# Patient Record
Sex: Female | Born: 1970 | Race: Black or African American | Hispanic: No | Marital: Married | Smoking: Never smoker
Health system: Southern US, Community
[De-identification: ages and names within clinical notes are randomized; demographics above are authoritative.]

## PROBLEM LIST (undated history)

## (undated) DIAGNOSIS — E785 Hyperlipidemia, unspecified: Secondary | ICD-10-CM

## (undated) DIAGNOSIS — M5126 Other intervertebral disc displacement, lumbar region: Secondary | ICD-10-CM

## (undated) DIAGNOSIS — E7800 Pure hypercholesterolemia, unspecified: Secondary | ICD-10-CM

## (undated) DIAGNOSIS — I10 Essential (primary) hypertension: Secondary | ICD-10-CM

---

## 2001-12-02 ENCOUNTER — Ambulatory Visit (HOSPITAL_COMMUNITY): Admission: RE | Admit: 2001-12-02 | Discharge: 2001-12-02 | Payer: Self-pay | Admitting: *Deleted

## 2001-12-25 ENCOUNTER — Ambulatory Visit (HOSPITAL_COMMUNITY): Admission: AD | Admit: 2001-12-25 | Discharge: 2001-12-25 | Payer: Self-pay | Admitting: *Deleted

## 2001-12-25 ENCOUNTER — Encounter (INDEPENDENT_AMBULATORY_CARE_PROVIDER_SITE_OTHER): Payer: Self-pay

## 2002-02-03 ENCOUNTER — Inpatient Hospital Stay (HOSPITAL_COMMUNITY): Admission: AD | Admit: 2002-02-03 | Discharge: 2002-02-03 | Payer: Self-pay | Admitting: *Deleted

## 2004-03-12 ENCOUNTER — Emergency Department (HOSPITAL_COMMUNITY): Admission: EM | Admit: 2004-03-12 | Discharge: 2004-03-12 | Payer: Self-pay | Admitting: Emergency Medicine

## 2004-03-13 ENCOUNTER — Emergency Department (HOSPITAL_COMMUNITY): Admission: EM | Admit: 2004-03-13 | Discharge: 2004-03-13 | Payer: Self-pay | Admitting: *Deleted

## 2004-10-26 ENCOUNTER — Emergency Department (HOSPITAL_COMMUNITY): Admission: EM | Admit: 2004-10-26 | Discharge: 2004-10-26 | Payer: Self-pay | Admitting: Emergency Medicine

## 2005-02-05 ENCOUNTER — Emergency Department (HOSPITAL_COMMUNITY): Admission: EM | Admit: 2005-02-05 | Discharge: 2005-02-05 | Payer: Self-pay | Admitting: Emergency Medicine

## 2005-12-25 ENCOUNTER — Ambulatory Visit: Payer: Self-pay | Admitting: Family Medicine

## 2007-04-11 ENCOUNTER — Emergency Department (HOSPITAL_COMMUNITY): Admission: EM | Admit: 2007-04-11 | Discharge: 2007-04-11 | Payer: Self-pay | Admitting: Emergency Medicine

## 2007-09-09 ENCOUNTER — Telehealth (INDEPENDENT_AMBULATORY_CARE_PROVIDER_SITE_OTHER): Payer: Self-pay | Admitting: *Deleted

## 2007-09-19 DIAGNOSIS — H5210 Myopia, unspecified eye: Secondary | ICD-10-CM | POA: Insufficient documentation

## 2007-09-19 DIAGNOSIS — I10 Essential (primary) hypertension: Secondary | ICD-10-CM | POA: Insufficient documentation

## 2007-09-19 DIAGNOSIS — H11009 Unspecified pterygium of unspecified eye: Secondary | ICD-10-CM | POA: Insufficient documentation

## 2007-10-17 ENCOUNTER — Ambulatory Visit: Payer: Self-pay | Admitting: Family Medicine

## 2007-10-18 ENCOUNTER — Ambulatory Visit: Payer: Self-pay | Admitting: Family Medicine

## 2007-10-18 LAB — CONVERTED CEMR LAB
ALT: 10 units/L (ref 0–35)
AST: 15 units/L (ref 0–37)
Albumin: 4.1 g/dL (ref 3.5–5.2)
Alkaline Phosphatase: 75 units/L (ref 39–117)
BUN: 11 mg/dL (ref 6–23)
Basophils Absolute: 0 10*3/uL (ref 0.0–0.1)
Basophils Relative: 1 % (ref 0–1)
CO2: 27 meq/L (ref 19–32)
Calcium: 9.2 mg/dL (ref 8.4–10.5)
Chloride: 105 meq/L (ref 96–112)
Cholesterol: 193 mg/dL (ref 0–200)
Creatinine, Ser: 0.55 mg/dL (ref 0.40–1.20)
Eosinophils Absolute: 0.1 10*3/uL — ABNORMAL LOW (ref 0.2–0.7)
Eosinophils Relative: 2 % (ref 0–5)
Glucose, Bld: 74 mg/dL (ref 70–99)
HCT: 43.3 % (ref 36.0–46.0)
HDL: 54 mg/dL (ref >39)
Hemoglobin: 14.1 g/dL (ref 12.0–15.0)
LDL Cholesterol: 115 mg/dL — ABNORMAL HIGH (ref 0–99)
Lymphocytes Relative: 43 % (ref 12–46)
Lymphs Abs: 1.8 10*3/uL (ref 0.7–4.0)
MCHC: 32.6 g/dL (ref 30.0–36.0)
MCV: 84.6 fL (ref 78.0–100.0)
Monocytes Absolute: 0.3 10*3/uL (ref 0.1–1.0)
Monocytes Relative: 8 % (ref 3–12)
Neutro Abs: 1.9 10*3/uL (ref 1.7–7.7)
Neutrophils Relative %: 47 % (ref 43–77)
Platelets: 184 10*3/uL (ref 150–400)
Potassium: 3.8 meq/L (ref 3.5–5.3)
RBC: 5.12 M/uL — ABNORMAL HIGH (ref 3.87–5.11)
RDW: 13.7 % (ref 11.5–15.5)
Sodium: 143 meq/L (ref 135–145)
TSH: 2.842 microintl units/mL (ref 0.350–5.50)
Total Bilirubin: 0.5 mg/dL (ref 0.3–1.2)
Total CHOL/HDL Ratio: 3.6
Total Protein: 7.7 g/dL (ref 6.0–8.3)
Triglycerides: 119 mg/dL (ref <150)
VLDL: 24 mg/dL (ref 0–40)
WBC: 4.1 10*3/uL (ref 4.0–10.5)

## 2007-12-22 ENCOUNTER — Ambulatory Visit: Payer: Self-pay | Admitting: Internal Medicine

## 2007-12-23 ENCOUNTER — Ambulatory Visit: Payer: Self-pay | Admitting: Nurse Practitioner

## 2007-12-23 DIAGNOSIS — M549 Dorsalgia, unspecified: Secondary | ICD-10-CM | POA: Insufficient documentation

## 2007-12-23 DIAGNOSIS — R319 Hematuria, unspecified: Secondary | ICD-10-CM | POA: Insufficient documentation

## 2007-12-23 DIAGNOSIS — M545 Low back pain, unspecified: Secondary | ICD-10-CM | POA: Insufficient documentation

## 2007-12-24 ENCOUNTER — Encounter (INDEPENDENT_AMBULATORY_CARE_PROVIDER_SITE_OTHER): Payer: Self-pay | Admitting: Nurse Practitioner

## 2008-01-02 ENCOUNTER — Ambulatory Visit: Payer: Self-pay | Admitting: Nurse Practitioner

## 2008-01-02 ENCOUNTER — Ambulatory Visit: Payer: Self-pay | Admitting: *Deleted

## 2008-01-02 LAB — CONVERTED CEMR LAB
Bilirubin Urine: NEGATIVE
Glucose, Urine, Semiquant: NEGATIVE
Ketones, urine, test strip: NEGATIVE
Nitrite: NEGATIVE
Protein, U semiquant: NEGATIVE
Specific Gravity, Urine: 1.01
Urobilinogen, UA: 0.2
WBC Urine, dipstick: NEGATIVE
pH: 6

## 2008-01-03 ENCOUNTER — Ambulatory Visit (HOSPITAL_COMMUNITY): Admission: RE | Admit: 2008-01-03 | Discharge: 2008-01-03 | Payer: Self-pay | Admitting: Family Medicine

## 2008-01-17 ENCOUNTER — Ambulatory Visit: Payer: Self-pay | Admitting: Nurse Practitioner

## 2008-01-19 ENCOUNTER — Ambulatory Visit (HOSPITAL_COMMUNITY): Admission: RE | Admit: 2008-01-19 | Discharge: 2008-01-19 | Payer: Self-pay | Admitting: Internal Medicine

## 2008-01-31 ENCOUNTER — Ambulatory Visit: Payer: Self-pay | Admitting: Nurse Practitioner

## 2008-02-14 ENCOUNTER — Encounter: Admission: RE | Admit: 2008-02-14 | Discharge: 2008-04-04 | Payer: Self-pay | Admitting: Nurse Practitioner

## 2008-02-14 ENCOUNTER — Encounter (INDEPENDENT_AMBULATORY_CARE_PROVIDER_SITE_OTHER): Payer: Self-pay | Admitting: Family Medicine

## 2008-02-15 ENCOUNTER — Ambulatory Visit: Payer: Self-pay | Admitting: Nurse Practitioner

## 2008-02-15 DIAGNOSIS — L578 Other skin changes due to chronic exposure to nonionizing radiation: Secondary | ICD-10-CM | POA: Insufficient documentation

## 2008-09-26 ENCOUNTER — Emergency Department (HOSPITAL_COMMUNITY): Admission: EM | Admit: 2008-09-26 | Discharge: 2008-09-26 | Payer: Self-pay | Admitting: Family Medicine

## 2008-12-24 ENCOUNTER — Telehealth (INDEPENDENT_AMBULATORY_CARE_PROVIDER_SITE_OTHER): Payer: Self-pay | Admitting: *Deleted

## 2009-03-13 ENCOUNTER — Ambulatory Visit: Payer: Self-pay | Admitting: Nurse Practitioner

## 2009-09-02 ENCOUNTER — Ambulatory Visit: Payer: Self-pay | Admitting: Nurse Practitioner

## 2009-09-02 DIAGNOSIS — K089 Disorder of teeth and supporting structures, unspecified: Secondary | ICD-10-CM | POA: Insufficient documentation

## 2009-09-02 DIAGNOSIS — B079 Viral wart, unspecified: Secondary | ICD-10-CM | POA: Insufficient documentation

## 2009-09-16 ENCOUNTER — Ambulatory Visit: Payer: Self-pay | Admitting: Nurse Practitioner

## 2009-09-30 ENCOUNTER — Ambulatory Visit: Payer: Self-pay | Admitting: Nurse Practitioner

## 2009-10-07 ENCOUNTER — Ambulatory Visit: Payer: Self-pay | Admitting: Nurse Practitioner

## 2009-10-29 ENCOUNTER — Ambulatory Visit: Payer: Self-pay | Admitting: Internal Medicine

## 2009-10-29 ENCOUNTER — Ambulatory Visit: Payer: Self-pay | Admitting: Nurse Practitioner

## 2009-11-13 ENCOUNTER — Encounter (INDEPENDENT_AMBULATORY_CARE_PROVIDER_SITE_OTHER): Payer: Self-pay | Admitting: Nurse Practitioner

## 2009-11-13 ENCOUNTER — Ambulatory Visit: Payer: Self-pay | Admitting: Family Medicine

## 2009-11-19 ENCOUNTER — Encounter (INDEPENDENT_AMBULATORY_CARE_PROVIDER_SITE_OTHER): Payer: Self-pay | Admitting: Nurse Practitioner

## 2009-11-20 ENCOUNTER — Ambulatory Visit: Payer: Self-pay | Admitting: Nurse Practitioner

## 2009-11-20 LAB — CONVERTED CEMR LAB
BUN: 10 mg/dL (ref 6–23)
CO2: 25 meq/L (ref 19–32)
Calcium: 9 mg/dL (ref 8.4–10.5)
Chloride: 101 meq/L (ref 96–112)
Creatinine, Ser: 0.67 mg/dL (ref 0.40–1.20)
Glucose, Bld: 125 mg/dL — ABNORMAL HIGH (ref 70–99)
Potassium: 3.8 meq/L (ref 3.5–5.3)
Sodium: 142 meq/L (ref 135–145)

## 2009-12-13 ENCOUNTER — Ambulatory Visit: Payer: Self-pay | Admitting: Nurse Practitioner

## 2009-12-13 LAB — CONVERTED CEMR LAB

## 2009-12-14 ENCOUNTER — Encounter (INDEPENDENT_AMBULATORY_CARE_PROVIDER_SITE_OTHER): Payer: Self-pay | Admitting: Nurse Practitioner

## 2009-12-16 ENCOUNTER — Encounter (INDEPENDENT_AMBULATORY_CARE_PROVIDER_SITE_OTHER): Payer: Self-pay | Admitting: Nurse Practitioner

## 2009-12-30 ENCOUNTER — Ambulatory Visit: Payer: Self-pay | Admitting: Nurse Practitioner

## 2009-12-30 LAB — CONVERTED CEMR LAB
Bilirubin Urine: NEGATIVE
Glucose, Urine, Semiquant: NEGATIVE
Ketones, urine, test strip: NEGATIVE
Nitrite: NEGATIVE
Protein, U semiquant: NEGATIVE
Specific Gravity, Urine: 1.015
Urobilinogen, UA: 0.2
WBC Urine, dipstick: NEGATIVE
pH: 5.5

## 2010-04-11 ENCOUNTER — Ambulatory Visit: Payer: Self-pay | Admitting: Nurse Practitioner

## 2010-09-12 ENCOUNTER — Ambulatory Visit: Payer: Self-pay | Admitting: Nurse Practitioner

## 2010-10-27 ENCOUNTER — Emergency Department (HOSPITAL_COMMUNITY): Admission: EM | Admit: 2010-10-27 | Discharge: 2010-10-27 | Payer: Self-pay | Admitting: Emergency Medicine

## 2010-12-12 ENCOUNTER — Telehealth (INDEPENDENT_AMBULATORY_CARE_PROVIDER_SITE_OTHER): Payer: Self-pay | Admitting: Nurse Practitioner

## 2010-12-16 ENCOUNTER — Ambulatory Visit: Admit: 2010-12-16 | Payer: Self-pay | Admitting: Nurse Practitioner

## 2011-01-04 LAB — CONVERTED CEMR LAB
ALT: 11 units/L (ref 0–35)
AST: 15 units/L (ref 0–37)
Albumin: 4.1 g/dL (ref 3.5–5.2)
Alkaline Phosphatase: 68 units/L (ref 39–117)
Basophils Absolute: 0 10*3/uL (ref 0.0–0.1)
Basophils Relative: 0 % (ref 0–1)
Bilirubin Urine: NEGATIVE
Bilirubin, Direct: <0.1 mg/dL (ref 0.0–0.3)
Chlamydia, DNA Probe: NEGATIVE
Eosinophils Absolute: 0.1 10*3/uL (ref 0.0–0.7)
Eosinophils Relative: 1 % (ref 0–5)
GC Probe Amp, Genital: NEGATIVE
Glucose, Urine, Semiquant: NEGATIVE
HCT: 40.3 % (ref 36.0–46.0)
Hemoglobin: 13.3 g/dL (ref 12.0–15.0)
KOH Prep: NEGATIVE
Ketones, urine, test strip: NEGATIVE
Lymphocytes Relative: 40 % (ref 12–46)
Lymphs Abs: 2.2 10*3/uL (ref 0.7–4.0)
MCHC: 33 g/dL (ref 30.0–36.0)
MCV: 83.1 fL (ref 78.0–100.0)
Microalb, Ur: 2.11 mg/dL — ABNORMAL HIGH (ref 0.00–1.89)
Monocytes Absolute: 0.4 10*3/uL (ref 0.1–1.0)
Monocytes Relative: 7 % (ref 3–12)
Neutro Abs: 2.8 10*3/uL (ref 1.7–7.7)
Neutrophils Relative %: 51 % (ref 43–77)
Nitrite: NEGATIVE
Platelets: 199 10*3/uL (ref 150–400)
Protein, U semiquant: NEGATIVE
RBC: 4.85 M/uL (ref 3.87–5.11)
RDW: 13.2 % (ref 11.5–15.5)
Rapid HIV Screen: NEGATIVE
Specific Gravity, Urine: 1.01
TSH: 1.193 microintl units/mL (ref 0.350–4.500)
Total Bilirubin: 0.2 mg/dL — ABNORMAL LOW (ref 0.3–1.2)
Total Protein: 7.3 g/dL (ref 6.0–8.3)
Urobilinogen, UA: 0.2
WBC Urine, dipstick: NEGATIVE
WBC: 5.5 10*3/uL (ref 4.0–10.5)
pH: 6.5

## 2011-01-06 NOTE — Assessment & Plan Note (Signed)
Summary: Blood pressure check   Patient Instructions: 1)  Blood pressure is still uncontrolled. 2)  You will need to start lisinopril 20mg  by mouth daily. 3)  Recheck blood pressure in 2 weeks Nurse Visit   Impression & Recommendations:  Problem # 1:  HYPERTENSION (ICD-401.9) still uncontrolled will need to change meds Her updated medication list for this problem includes:    Lisinopril 20 Mg Tabs (Lisinopril) ..... One tablet by mouth daily for blood pressure *note change dose**  Complete Medication List: 1)  Lisinopril 20 Mg Tabs (Lisinopril) .... One tablet by mouth daily for blood pressure *note change dose** 2)  Ibuprofen 600 Mg Tabs (Ibuprofen) .Marland Kitchen.. 1 tablet by mouth two times a day as needed for pain 3)  Solage 2-0.01 % Soln (Mequinol-tretinoin) .Marland Kitchen.. 1 application topically two times a day to affected area 4)  Ecotrin Low Strength 81 Mg Tbec (Aspirin) .Marland Kitchen.. 1 tablet by mouth daily   Vital Signs:  Patient profile:   40 year old female BP sitting:   158 / 93  (left arm) Cuff size:   regular  Allergies: No Known Drug Allergies  Orders Added: 1)  Est. Patient Level I [95621] Prescriptions: LISINOPRIL 20 MG TABS (LISINOPRIL) One tablet by mouth daily for blood pressure *note change dose**  #30 x 1   Entered by:   Vesta Mixer CMA   Authorized by:   Lehman Prom FNP   Signed by:   Vesta Mixer CMA on 10/07/2009   Method used:   Print then Give to Patient   RxID:   [id number]

## 2011-01-06 NOTE — Assessment & Plan Note (Signed)
Summary: Acute - Low back pain   Vital Signs:  Patient profile:   40 year old female Height:      61 inches Weight:      152 pounds BMI:     28.82 BSA:     1.68 Temp:     98.8 degrees F oral Pulse rate:   88 / minute Pulse rhythm:   regular Resp:     20 per minute BP sitting:   98 / 56  (left arm) Cuff size:   regular  Vitals Entered By: Levon Hedger (March 13, 2009 12:02 PM) CC: pain in lower back on the left side, hurts when lying down, Hypertension Management Is Patient Diabetic? No Pain Assessment Patient in pain? yes     Location: lower back  Does patient need assistance? Functional Status Self care Ambulation Normal   History of Present Illness:  Pt into the office still with continued lower back pain and hip pain Pt went for x-rays and CT on last year.  She also went to physical therapy for 3 months Pt is not currently taking any medications. Reports that back feels worse when she is sitting   Hypertension History:      pt is taking hctz 12.5 mg as ordered. c/o dry mouth all the time.  has to constantly drink water.        Positive major cardiovascular risk factors include hypertension.  Negative major cardiovascular risk factors include female age less than 24 years old and non-tobacco-user status.    Medications Prior to Update: 1)  Hydrochlorothiazide 12.5 Mg  Tabs (Hydrochlorothiazide) .... Take 1 Tab  By Mouth Every Morning For Blood Pressure 2)  Ibuprofen 600 Mg  Tabs (Ibuprofen) .Marland Kitchen.. 1 Tablet By Mouth Two Times A Day As Needed For Pain 3)  Solage 2-0.01 %  Soln (Mequinol-Tretinoin) .Marland Kitchen.. 1 Application Topically Two Times A Day To Affected Area 4)  Ecotrin Low Strength 81 Mg  Tbec (Aspirin) .Marland Kitchen.. 1 Tablet By Mouth Daily  Allergies (verified): No Known Drug Allergies  Review of Systems CV:  Denies chest pain or discomfort. Resp:  Denies cough. GI:  Denies abdominal pain, nausea, and vomiting. MS:  Complains of low back pain.  Physical  Exam  General:  alert.   Head:  normocephalic.   Lungs:  normal breath sounds.   Heart:  normal rate and regular rhythm.   Abdomen:  normal bowel sounds.     Detailed Back/Spine Exam  Lumbosacral Exam:  Inspection-deformity:    Normal Palpation-spinal tenderness:  Abnormal    Location:  L5-S1 Sitting Straight Leg Raise:    Right:  negative    Left:  negative   Impression & Recommendations:  Problem # 1:  BACK PAIN, LUMBAR (ICD-724.2) pt has been to PT  will start ibuprofen as needed  advised doughnut Her updated medication list for this problem includes:    Ibuprofen 600 Mg Tabs (Ibuprofen) .Marland Kitchen... 1 tablet by mouth two times a day as needed for pain    Ecotrin Low Strength 81 Mg Tbec (Aspirin) .Marland Kitchen... 1 tablet by mouth daily  Problem # 2:  HYPERTENSION (ICD-401.9) c/o dry mouth. will change bp meds to lisinopril Her updated medication list for this problem includes:    Lisinopril 5 Mg Tabs (Lisinopril) ..... One tablet by mouth daily for blood pressure **pharmacy - discontinue hctz**  Complete Medication List: 1)  Lisinopril 5 Mg Tabs (Lisinopril) .... One tablet by mouth daily for blood pressure **pharmacy - discontinue  hctz** 2)  Ibuprofen 600 Mg Tabs (Ibuprofen) .Marland Kitchen.. 1 tablet by mouth two times a day as needed for pain 3)  Solage 2-0.01 % Soln (Mequinol-tretinoin) .Marland Kitchen.. 1 application topically two times a day to affected area 4)  Ecotrin Low Strength 81 Mg Tbec (Aspirin) .Marland Kitchen.. 1 tablet by mouth daily  Hypertension Assessment/Plan:      The patient's hypertensive risk group is category A: No risk factors and no target organ damage.  Her calculated 10 year risk of coronary heart disease is 1 %.  Today's blood pressure is 98/56.  Her blood pressure goal is < 140/90.   Patient Instructions: 1)  You need to get a doughnut pillow to help prevent pain. 2)  You can take ibuprofen as needed for pain 3)  Follow up as needed. Prescriptions: LISINOPRIL 5 MG TABS (LISINOPRIL) One  tablet by mouth daily for blood pressure **Pharmacy - discontinue hctz**  #30 x 5   Entered and Authorized by:   Lehman Prom FNP   Signed by:   Lehman Prom FNP on 03/13/2009   Method used:   Print then Give to Patient   RxID:   [id number] IBUPROFEN 600 MG  TABS (IBUPROFEN) 1 tablet by mouth two times a day as needed for pain  #50 x 1   Entered and Authorized by:   Lehman Prom FNP   Signed by:   Lehman Prom FNP on 03/13/2009   Method used:   Print then Give to Patient   RxID:   [id number]

## 2011-01-06 NOTE — Assessment & Plan Note (Signed)
Summary: HTN   Vital Signs:  Patient profile:   40 year old female Menstrual status:  regular Height:      61 inches Weight:      148 pounds BMI:     28.07 BSA:     1.66 Temp:     97.4 degrees F oral Pulse rate:   69 / minute Pulse rhythm:   regular Resp:     16 per minute BP sitting:   131 / 81  (left arm) Cuff size:   regular  Vitals Entered By: [place] Shannon (Apr 11, 2010 2:14 PM) CC: 3 month check up on blood pressure, Hypertension Management Pain Assessment Patient in pain? yes     Location: stomach  Does patient need assistance? Functional Status Self care Ambulation Normal   CC:  3 month check up on blood pressure and Hypertension Management.  History of Present Illness:  Pt into the office for follow up on htn.  Social - pt is going to travel to [place].  She will be gone for 2 months to visit her family. Mother and father are still in [place]  No acute complaints today  Hypertension History:      She denies headache, chest pain, and palpitations.  She notes no problems with any antihypertensive medication side effects.  pt is taking meds as ordered.  Further comments include: no side effects from the medications.        Positive major cardiovascular risk factors include hypertension.  Negative major cardiovascular risk factors include female age less than 67 years old and non-tobacco-user status.        Further assessment for target organ damage reveals no history of ASHD, cardiac end-organ damage (CHF/LVH), stroke/TIA, peripheral vascular disease, renal insufficiency, or hypertensive retinopathy.     Allergies (verified): 1)  ! Lisinopril  Review of Systems CV:  Denies chest pain or discomfort. Resp:  Denies cough. GI:  Complains of indigestion; denies abdominal pain, nausea, and vomiting; one episode of indigestion on yesterday but pt admits that she ate some spicy foods on yesterday.  Physical Exam  General:  alert.   Head:  normocephalic.   covered Lungs:  normal breath sounds.   Heart:  normal rate and regular rhythm.   Msk:  up to the exam table Neurologic:  alert & oriented X3.   Psych:  Oriented X3.     Impression & Recommendations:  Problem # 1:  HYPERTENSION (ICD-401.9) BP  is doing well continue current med will give 90 day supply due to upcoming travel plans Her updated medication list for this problem includes:    Hydrochlorothiazide 25 Mg Tabs (Hydrochlorothiazide) .Marland Kitchen... Take one (1) by mouth daily    Atenolol 25 Mg Tabs (Atenolol) ..... One tablet by mouth daily for blood pressure  Complete Medication List: 1)  Ibuprofen 600 Mg Tabs (Ibuprofen) .Marland Kitchen.. 1 tablet by mouth two times a day as needed for pain 2)  Solage 2-0.01 % Soln (Mequinol-tretinoin) .Marland Kitchen.. 1 application topically two times a day to affected area 3)  Ecotrin Low Strength 81 Mg Tbec (Aspirin) .Marland Kitchen.. 1 tablet by mouth daily 4)  Hydrochlorothiazide 25 Mg Tabs (Hydrochlorothiazide) .... Take one (1) by mouth daily 5)  Atenolol 25 Mg Tabs (Atenolol) .... One tablet by mouth daily for blood pressure  Hypertension Assessment/Plan:      The patient's hypertensive risk group is category A: No risk factors and no target organ damage.  Her calculated 10 year risk of coronary heart disease  is 1 %.  Today's blood pressure is 131/81.  Her blood pressure goal is < 140/90.  Patient Instructions: 1)  Your blood pressure is doing much better. 2)  Continue your current medications. 3)  Have a great trip. Prescriptions: ATENOLOL 25 MG TABS (ATENOLOL) One tablet by mouth daily for blood pressure  #90 x 1   Entered and Authorized by:   Lehman Prom FNP   Signed by:   Lehman Prom FNP on 04/11/2010   Method used:   Print then Give to Patient   RxID:   [id number] HYDROCHLOROTHIAZIDE 25 MG TABS (HYDROCHLOROTHIAZIDE) Take one (1) by mouth daily  #90 x 1   Entered and Authorized by:   Lehman Prom FNP   Signed by:   Lehman Prom FNP on 04/11/2010    Method used:   Print then Give to Patient   RxID:   [id number]

## 2011-01-06 NOTE — Letter (Signed)
Summary: Retasure Resullts 10/29/2009  Retasure Resullts 10/29/2009   Imported By: Percell Miller 11/19/2009 11:07:48  _____________________________________________________________________  External Attachment:    Type:   Image     Comment:   External Document  Appended Document: Retasure Resullts 10/29/2009  Diabetes Management Exam:    Eye Exam:       Eye Exam done elsewhere          Date: 10/29/2009          Results: suspect glaucoma - optho referral within 3 months          Done by: retasure  Diabetes Management Assessment/Plan:      Her blood pressure goal is < 140/90.      Clinical Lists Changes  Observations: Added new observation of EYES COMMENT: 11/2010 (11/20/2009 8:35) Added new observation of EYE EXAM BY: retasure (10/29/2009 8:36) Added new observation of DMEYEEXMRES: suspect glaucoma - optho referral within 3 months (10/29/2009 8:36) Added new observation of DIAB EYE EX: suspect glaucoma - optho referral within 3 months (10/29/2009 8:36)

## 2011-01-06 NOTE — Letter (Signed)
Summary: Handout Printed  Printed Handout:  - [facility] ([facility]) 

## 2011-01-06 NOTE — Assessment & Plan Note (Signed)
Summary: HTN/Solar dermatitis   Vital Signs:  Patient Profile:   40 Years Old Female Height:     61 inches Weight:      152 pounds BMI:     28.82 BSA:     1.68 Temp:     97.0 degrees F oral Pulse rate:   72 / minute Pulse rhythm:   regular Resp:     18 per minute BP sitting:   150 / 90  (left arm) Cuff size:   regular  Pt. in pain?   no  Vitals Entered By: Levon Hedger (February 15, 2008 9:47 AM)              Is Patient Diabetic? No  Does patient need assistance? Ambulation Normal Comments pt needs Enteric coated ASA, HCTZ, Solage, Hydrocortisone cream     Chief Complaint:  medication refills.  History of Present Illness:  HTN - Pt into the office for blood pressure f/u.    Physical therapy - she went for evaluation on yesturday.  She will then start her therapy twice weekly starting next week. She does have some ibuprofen to take as needed but she does not like to take it.  Facial discoloration - pt has been given hydrocortisone cream.  She presents today with a cream that she has known other people from her country to use that have similar problems.  the discoloration is due to sun exposure Solage is the name of the cream.  Instructions are to use twice daily.  Hypertension History:      She denies headache, chest pain, and palpitations.  She notes no problems with any antihypertensive medication side effects.  Further comments include: Pt hs been checking her blood pressure at home.  Log reflects 128/88, 133/86, 128/88, 138/86, 119/75, 137/95, 150/98, 146/95, 136/86, 125/82, 132/85, 137/87, 136/86, 126/76, 131/81, 140/90. 128/88, 134/87, 150/92, 138/86,147/90, 136/86, 145/90.        Positive major cardiovascular risk factors include hypertension.  Negative major cardiovascular risk factors include female age less than 87 years old and non-tobacco-user status.       Prior Medication List:  HYDROCHLOROTHIAZIDE 12.5 MG  TABS (HYDROCHLOROTHIAZIDE) Take 1 tab  by mouth  every morning IBUPROFEN 600 MG  TABS (IBUPROFEN) 1 tablet by mouth two times a day as needed for pain HYDROCORTISONE 2.5 %  OINT (HYDROCORTISONE) 1 application to affected area daily   Current Allergies: No known allergies     Risk Factors: Tobacco use:  never Drug use:  no Alcohol use:  no   Review of Systems  Eyes      Complains of blurring.      when she reads for long period of time  CV      Denies chest pain or discomfort, fatigue, and shortness of breath with exertion.  Resp      Denies chest discomfort and shortness of breath.  GI      Denies abdominal pain, nausea, and vomiting.  MS      Complains of low back pain.  Derm      facial discoloration   Physical Exam  General:     alert.   Head:     covered Eyes:     glasses Nose:     no external deformity.   Lungs:     normal respiratory effort, no intercostal retractions, no accessory muscle use, and normal breath sounds.   Heart:     normal rate, regular rhythm, and no murmur.   Abdomen:  soft, non-tender, and normal bowel sounds.   Msk:     up to the exam table Neurologic:     alert & oriented X3.   Skin:     bil cheeks with areas of hyperpigmentation no macules or papules affected area is smooth Psych:     Oriented X3.      Impression & Recommendations:  Problem # 1:  HYPERTENSION (ICD-401.9) continue current meds. sodium restriction Her updated medication list for this problem includes:    Hydrochlorothiazide 12.5 Mg Tabs (Hydrochlorothiazide) .Marland Kitchen... Take 1 tab  by mouth every morning for blood pressure   Problem # 2:  UNSPECIFIED DERMATITIS DUE TO SUN (ICD-692.70) will give Rx for cream as requested.  she will need to get from an outside pharmacy. The following medications were removed from the medication list:    Hydrocortisone 2.5 % Oint (Hydrocortisone) .Marland Kitchen... 1 application to affected area daily   Problem # 3:  BACK PAIN, LUMBAR (ICD-724.2) continue physical therapy as  ordered Her updated medication list for this problem includes:    Ibuprofen 600 Mg Tabs (Ibuprofen) .Marland Kitchen... 1 tablet by mouth two times a day as needed for pain    Ecotrin Low Strength 81 Mg Tbec (Aspirin) .Marland Kitchen... 1 tablet by mouth daily   Complete Medication List: 1)  Hydrochlorothiazide 12.5 Mg Tabs (Hydrochlorothiazide) .... Take 1 tab  by mouth every morning for blood pressure 2)  Ibuprofen 600 Mg Tabs (Ibuprofen) .Marland Kitchen.. 1 tablet by mouth two times a day as needed for pain 3)  Solage 2-0.01 % Soln (Mequinol-tretinoin) .Marland Kitchen.. 1 application topically two times a day to affected area 4)  Ecotrin Low Strength 81 Mg Tbec (Aspirin) .Marland Kitchen.. 1 tablet by mouth daily  Hypertension Assessment/Plan:      The patient's hypertensive risk group is category A: No risk factors and no target organ damage.  Her calculated 10 year risk of coronary heart disease is 2 %.  Today's blood pressure is 150/90.  Her blood pressure goal is < 140/90.   Patient Instructions: 1)  Follow up as needed 2)  Continue physical therapy as ordered    Prescriptions: HYDROCHLOROTHIAZIDE 12.5 MG  TABS (HYDROCHLOROTHIAZIDE) Take 1 tab  by mouth every morning for blood pressure  #30 x 5   Entered and Authorized by:   Lehman Prom FNP   Signed by:   Lehman Prom FNP on 02/15/2008   Method used:   Print then Give to Patient   RxID:   [id number] SOLAGE 2-0.01 %  SOLN (MEQUINOL-TRETINOIN) 1 application topically two times a day to affected area  #30gm x 1   Entered and Authorized by:   Lehman Prom FNP   Signed by:   Lehman Prom FNP on 02/15/2008   Method used:   Print then Give to Patient   RxID:   [id number]  ]

## 2011-01-06 NOTE — Assessment & Plan Note (Signed)
Summary: Blood pressure check  Nurse Visit   Vital Signs:  Patient profile:   40 year old female Pulse rate:   79 / minute Pulse rhythm:   regular BP sitting:   152 / 91  (left arm) Cuff size:   regular  Vitals Entered By: [place] Shannon (November 20, 2009 9:09 AM) pt says she took meds before 6 am  Patient Instructions: 1)  Blood pressure - still high. 2)  Continue HCTZ. 3)  Start atenolol 25mg  by mouth nightly. 4)  Schedule a follow up with n.martin,fnp in 3 weeks for blood presure   Impression & Recommendations:  Problem # 1:  HYPERTENSION (ICD-401.9) still elevated today will add atenolol Her updated medication list for this problem includes:    Hydrochlorothiazide 25 Mg Tabs (Hydrochlorothiazide) .Marland Kitchen... Take one (1) by mouth daily    Atenolol 25 Mg Tabs (Atenolol) ..... One tablet by mouth daily for blood pressure  Complete Medication List: 1)  Ibuprofen 600 Mg Tabs (Ibuprofen) .Marland Kitchen.. 1 tablet by mouth two times a day as needed for pain 2)  Solage 2-0.01 % Soln (Mequinol-tretinoin) .Marland Kitchen.. 1 application topically two times a day to affected area 3)  Ecotrin Low Strength 81 Mg Tbec (Aspirin) .Marland Kitchen.. 1 tablet by mouth daily 4)  Hydrochlorothiazide 25 Mg Tabs (Hydrochlorothiazide) .... Take one (1) by mouth daily 5)  Atenolol 25 Mg Tabs (Atenolol) .... One tablet by mouth daily for blood pressure  Hypertension Assessment/Plan:      The patient's hypertensive risk group is category A: No risk factors and no target organ damage.  Her calculated 10 year risk of coronary heart disease is 2 %.  Today's blood pressure is 152/91.  Her blood pressure goal is < 140/90.    Hypertension History:      She denies headache, chest pain, and palpitations.  She notes no problems with any antihypertensive medication side effects.        Positive major cardiovascular risk factors include hypertension.  Negative major cardiovascular risk factors include female age less than 74 years old and  non-tobacco-user status.        Further assessment for target organ damage reveals no history of ASHD, cardiac end-organ damage (CHF/LVH), stroke/TIA, peripheral vascular disease, renal insufficiency, or hypertensive retinopathy.      Allergies: No Known Drug Allergies  Orders Added: 1)  Est. Patient Level II [78469] Prescriptions: ATENOLOL 25 MG TABS (ATENOLOL) One tablet by mouth daily for blood pressure  #30 x 1   Entered and Authorized by:   Lehman Prom FNP   Signed by:   Lehman Prom FNP on 11/20/2009   Method used:   Print then Give to Patient   RxID:   [id number]

## 2011-01-06 NOTE — Assessment & Plan Note (Signed)
Summary: Review labs   Vital Signs:  Patient profile:   40 year old female Menstrual status:  regular Weight:      148.2 pounds BMI:     28.10 BSA:     1.66 Temp:     97.9 degrees F oral Pulse rate:   70 / minute Pulse rhythm:   regular Resp:     16 per minute BP sitting:   128 / 82  (left arm) Cuff size:   regular  Vitals Entered By: Levon Hedger (December 30, 2009 12:09 PM) CC: follow-up visit discuss lab results, Hypertension Management, Depression Is Patient Diabetic? No Pain Assessment Patient in pain? yes     Location: muscles  Does patient need assistance? Functional Status Self care Ambulation Normal   CC:  follow-up visit discuss lab results, Hypertension Management, and Depression.  History of Present Illness:  Pt into the office for follow up on lab results U/s shows hematuria culture negative CT of Abd/pelvis done in 2009 for hematuria which was negative. no current problems.    Hypertension History:      She denies headache, chest pain, and palpitations.  She notes no problems with any antihypertensive medication side effects.        Positive major cardiovascular risk factors include hypertension.  Negative major cardiovascular risk factors include female age less than 24 years old and non-tobacco-user status.        Further assessment for target organ damage reveals no history of ASHD, cardiac end-organ damage (CHF/LVH), stroke/TIA, peripheral vascular disease, renal insufficiency, or hypertensive retinopathy.      Allergies (verified): 1)  ! Lisinopril  Review of Systems CV:  Denies chest pain or discomfort. Resp:  Denies cough. GI:  Denies abdominal pain, nausea, and vomiting. GU:  Denies dysuria and hematuria.  Physical Exam  General:  alert.   Head:  normocephalic.   Msk:  normal ROM.   Neurologic:  alert & oriented X3.   Skin:  color normal.   Psych:  Oriented X3.     Impression & Recommendations:  Problem # 1:  HEMATURIA  UNSPECIFIED (ICD-599.70)  labs reviewed with pt CT done in 2009 seems satisifed at no further workup at this time  Orders: UA Dipstick w/o Micro (manual) (16109)  Problem # 2:  HYPERTENSION (ICD-401.9) stable Her updated medication list for this problem includes:    Hydrochlorothiazide 25 Mg Tabs (Hydrochlorothiazide) .Marland Kitchen... Take one (1) by mouth daily    Atenolol 25 Mg Tabs (Atenolol) ..... One tablet by mouth daily for blood pressure  Complete Medication List: 1)  Ibuprofen 600 Mg Tabs (Ibuprofen) .Marland Kitchen.. 1 tablet by mouth two times a day as needed for pain 2)  Solage 2-0.01 % Soln (Mequinol-tretinoin) .Marland Kitchen.. 1 application topically two times a day to affected area 3)  Ecotrin Low Strength 81 Mg Tbec (Aspirin) .Marland Kitchen.. 1 tablet by mouth daily 4)  Hydrochlorothiazide 25 Mg Tabs (Hydrochlorothiazide) .... Take one (1) by mouth daily 5)  Atenolol 25 Mg Tabs (Atenolol) .... One tablet by mouth daily for blood pressure  Hypertension Assessment/Plan:      The patient's hypertensive risk group is category A: No risk factors and no target organ damage.  Her calculated 10 year risk of coronary heart disease is 1 %.  Today's blood pressure is 128/82.  Her blood pressure goal is < 140/90.  Patient Instructions: 1)  You have blood in your urine 2)  Follow up in this office at least every 3 months.  Laboratory  Results   Urine Tests  Date/Time Received: December 30, 2009 12:32 PM   Routine Urinalysis   Color: lt. yellow Appearance: Clear Glucose: negative   (Normal Range: Negative) Bilirubin: negative   (Normal Range: Negative) Ketone: negative   (Normal Range: Negative) Spec. Gravity: 1.015   (Normal Range: 1.003-1.035) Blood: moderate   (Normal Range: Negative) pH: 5.5   (Normal Range: 5.0-8.0) Protein: negative   (Normal Range: Negative) Urobilinogen: 0.2   (Normal Range: 0-1) Nitrite: negative   (Normal Range: Negative) Leukocyte Esterace: negative   (Normal Range: Negative)

## 2011-01-06 NOTE — Letter (Signed)
Summary: Handout Printed  Printed Handout:  - Low Back Pain

## 2011-01-06 NOTE — Assessment & Plan Note (Signed)
Summary: 3629 PT/SIDE EFFECTS TO LISINOPRIL//KT  Nurse Visit   Vital Signs:  Patient profile:   40 year old female BP sitting:   154 / 99  (left arm) Cuff size:   regular  Patient Instructions: 1)  Stop Lisinopril 2)  Start Hydrochlorothiazide 25mg  3)  Repeat blood pressure in 2 weeks.  Comments PT COMPLAINING OF LISINOPRIL MAKING HER FEEL NAUSEATED, DIZZY, CHEST PAIN AND COUGHING. Per DR. iNMAN STOP LISINOPRIL AND START HCTZ 25MG  1PO once daily RPT BP IN TWO WKS     Allergies: No Known Drug Allergies Prescriptions: HYDROCHLOROTHIAZIDE 25 MG TABS (HYDROCHLOROTHIAZIDE) Take one (1) by mouth daily  #30 x 1   Entered by:   Vesta Mixer CMA   Authorized by:   Syliva Overman MD   Signed by:   Vesta Mixer CMA on 11/13/2009   Method used:   Telephoned to ...       [facility] [address] [phone]* (retail)       [address]       [place], [place]  [address]       Ph: [phone]       Fax: [phone]   RxID:   [id number]   Current Allergies: No known allergies      Vital Signs:  Patient profile:   40 year old female BP sitting:   154 / 99  (left arm) Cuff size:   regular  Appended Document: 3629 PT/SIDE EFFECTS TO LISINOPRIL//KT   Appended Document: 3629 PT/SIDE EFFECTS TO LISINOPRIL//KT

## 2011-01-06 NOTE — Assessment & Plan Note (Signed)
Summary: HTN   Vital Signs:  Patient profile:   40 year old female Menstrual status:  regular Weight:      147.7 pounds Temp:     97.7 degrees F oral Pulse rate:   68 / minute Pulse rhythm:   regular Resp:     16 per minute BP sitting:   108 / 60  (left arm) Cuff size:   regular  Vitals Entered By: Levon Hedger (September 12, 2010 11:16 AM) CC: follow-up visit, Hypertension Management Is Patient Diabetic? No Pain Assessment Patient in pain? no       Does patient need assistance? Functional Status Self care Ambulation Normal   CC:  follow-up visit and Hypertension Management.  History of Present Illness:  Pt into the office for f/u on HTN Pt has been to [place] for 2 months.  S/p dental extraction on yesterday - hx of broken tooth which had abscessed.  Still with some pain right side of face.  She has taken hydrocodone as ordered by the dentist but reports that it made her very sleepy.  Pt is asking if she needs to take this medication for pain if she has to keep taking the medication.    Hypertension History:      She denies headache, chest pain, and palpitations.  She notes no problems with any antihypertensive medication side effects.  Pt is still taking her BP meds as ordered.        Positive major cardiovascular risk factors include hypertension.  Negative major cardiovascular risk factors include female age less than 73 years old and non-tobacco-user status.        Further assessment for target organ damage reveals no history of ASHD, cardiac end-organ damage (CHF/LVH), stroke/TIA, peripheral vascular disease, renal insufficiency, or hypertensive retinopathy.     Allergies (verified): 1)  ! Lisinopril  Review of Systems General:  +mouth pain s/p dental procedure on yesterday. CV:  Denies chest pain or discomfort. Resp:  Denies cough. GI:  Denies abdominal pain, nausea, and vomiting. Derm:  right index finger. Neuro:  Denies headaches.  Physical  Exam  General:  alert.   Head:  normocephalic.   head covered Lungs:  normal breath sounds.   Heart:  normal rate and regular rhythm.   Abdomen:  normal bowel sounds.   Msk:  normal ROM.   Neurologic:  alert & oriented X3.     Impression & Recommendations:  Problem # 1:  HYPERTENSION (ICD-401.9) BP is doing well will refill meds Her updated medication list for this problem includes:    Hydrochlorothiazide 25 Mg Tabs (Hydrochlorothiazide) .Marland Kitchen... Take one (1) by mouth daily    Atenolol 25 Mg Tabs (Atenolol) ..... One tablet by mouth daily for blood pressure pt declined flu vaccine today  Problem # 3:  WART, RIGHT HAND (ICD-078.10) reviewed dx with pt she will use otc preparations  Complete Medication List: 1)  Ibuprofen 600 Mg Tabs (Ibuprofen) .Marland Kitchen.. 1 tablet by mouth two times a day as needed for pain 2)  Solage 2-0.01 % Soln (Mequinol-tretinoin) .Marland Kitchen.. 1 application topically two times a day to affected area 3)  Ecotrin Low Strength 81 Mg Tbec (Aspirin) .Marland Kitchen.. 1 tablet by mouth daily 4)  Hydrochlorothiazide 25 Mg Tabs (Hydrochlorothiazide) .... Take one (1) by mouth daily 5)  Atenolol 25 Mg Tabs (Atenolol) .... One tablet by mouth daily for blood pressure  Hypertension Assessment/Plan:      The patient's hypertensive risk group is category A: No risk factors  and no target organ damage.  Her calculated 10 year risk of coronary heart disease is 1 %.  Today's blood pressure is 108/60.  Her blood pressure goal is < 140/90.  Patient Instructions: 1)  You have refused the flu vaccine today. 2)  If you decide you would like the flu vaccine then notify this office 3)  Follow up in January for a complete physical exam. 4)  No food after midnight before this visit. 5)  You will need labs, PAP, mammogram, u/a. Prescriptions: ATENOLOL 25 MG TABS (ATENOLOL) One tablet by mouth daily for blood pressure  #90 x 1   Entered and Authorized by:   Lehman Prom FNP   Signed by:   Lehman Prom  FNP on 09/12/2010   Method used:   Print then Give to Patient   RxID:   [id number] HYDROCHLOROTHIAZIDE 25 MG TABS (HYDROCHLOROTHIAZIDE) Take one (1) by mouth daily  #90 x 1   Entered and Authorized by:   Lehman Prom FNP   Signed by:   Lehman Prom FNP on 09/12/2010   Method used:   Print then Give to Patient   RxID:   [id number]   Prevention & Chronic Care Immunizations   Influenza vaccine: refused  (09/12/2010)   Influenza vaccine deferral: Refused  (12/13/2009)    Tetanus booster: 12/13/2009: Tdap    Pneumococcal vaccine: Not documented  Other Screening   Pap smear:  Specimen Adequacy: Satisfactory for evaluation.     (12/13/2009)   Pap smear action/deferral: Ordered  (12/13/2009)   Pap smear due: 12/2010   Smoking status: never  (12/13/2009)  Lipids   Total Cholesterol: 193  (10/18/2007)   LDL: 115  (10/18/2007)   LDL Direct: Not documented   HDL: 54  (10/18/2007)   Triglycerides: 119  (10/18/2007)  Hypertension   Last Blood Pressure: 108 / 60  (09/12/2010)   Serum creatinine: 0.67  (11/13/2009)   Serum potassium 3.8  (11/13/2009)  Self-Management Support :    Hypertension self-management support: Not documented

## 2011-01-06 NOTE — Letter (Signed)
Summary: *HSN Results Follow up  [facility]  [address] [place], [place] [address]   Phone: [phone]  Fax: [phone]      11/20/2009   ANNETE AYUSO [facility] [address] [place], [place]  [address]   Dear  Ms. Maryam Noll,                            ____S.Drinkard,FNP   ____D. Gore,FNP       ____B. McPherson,MD   ____V. Rankins,MD    ____E. Mulberry,MD    __X__N. Daphine Deutscher, FNP  ____D. Reche Dixon, MD    ____K. Philipp Deputy, MD    ____Other     This letter is to inform you that your recent test(s):  Retasure Eye Exam    _______ is within acceptable limits  _______ requires a medication change  _______ requires a follow-up lab visit  ___X____ requires a follow-up visit with an eye doctor  Comments: The retasure eye exam done at [facility] [address] was abnormal.  It is recommended that you get an eye exam by an eye doctor within the next 3 months. Feel free to find your own eye doctor or [facility] can refer you however there is a $115 co-payment required at time of service with this referral.     _________________________________________________________ If you have any questions, please contact our office [phone].                    Sincerely,    Lehman Prom FNP [facility]

## 2011-01-06 NOTE — Progress Notes (Signed)
Summary: Polydipsia  Phone Note Call from Patient Call back at Home Phone [phone]   Caller: Patient Call For: [phone] Summary of Call: The pt feels her mouth is very dry and if she drink water it only help her for a short amount of time. Dr Barbaraann Barthel Initial call taken by: Manon Hilding,  December 24, 2008 10:10 AM  Follow-up for Phone Call        pt states she feels dehydrated. She is drinking alot of and still feels like this. She states her mouth is constantly dry and she is not sure if its from her medication. (hctz takes every morning) or does the patient need to come in to see provider.Marland KitchenMarland KitchenMikey College CMA  December 25, 2008 3:51 PM   Additional Follow-up for Phone Call Additional follow up Details #1::        #1 She needs to see nurse for a fasting CBG.  #2 Please clarify with patient who is her PCP.Marland Kitchenshe has seen N.Martin multiple times since she saw me for first visit. It would make more sense for N.Martin to be her assigned PCP...? Additional Follow-up by: Beverley Fiedler MD,  December 25, 2008 5:20 PM    Additional Follow-up for Phone Call Additional follow up Details #2::     left message to return........................Marland KitchenMikey College CMA  January 03, 2009 11:51 AM   appt scheduled for patient............. Follow-up by: Mikey College CMA,  January 09, 2009 8:57 AM

## 2011-01-06 NOTE — Letter (Signed)
Summary: REHAB INITIAL SUMMARY  REHAB INITIAL SUMMARY   Imported By: Arta Bruce 02/17/2008 10:13:43  _____________________________________________________________________  External Attachment:    Type:   Image     Comment:   External Document

## 2011-01-06 NOTE — Assessment & Plan Note (Signed)
Summary: 2209 pt/pain lower back/gk   Vital Signs:  Patient Profile:   40 Years Old Female Height:     61 inches Weight:      152 pounds BMI:     28.82 Temp:     98.3 degrees F oral Pulse rate:   82 / minute Pulse rhythm:   regular Resp:     20 per minute BP sitting:   128 / 82  (left arm) Cuff size:   regular  Vitals Entered By:  (December 22, 2007 4:23 PM)                Patient locked herself out of car while it was running at office.  Did not come back in office after getting back in vehicle.  Current Allergies: No known allergies         Complete Medication List: 1)  Hydrochlorothiazide 12.5 Mg Tabs (Hydrochlorothiazide) .... Take 1 tab  by mouth every morning     ]

## 2011-01-06 NOTE — Assessment & Plan Note (Signed)
Summary: pain lower area per couple weeks//gk   Vital Signs:  Patient Profile:   40 Years Old Female Height:     61 inches Weight:      145 pounds BMI:     27.50 BSA:     1.65 Temp:     99.0 degrees F oral Pulse rate:   68 / minute Pulse rhythm:   regular Resp:     18 per minute BP sitting:   138 / 90  (left arm)  Pt. in pain?   yes    Location:   lower back  Vitals Entered By: Bradd Burner               Does patient need assistance? Functional Status Self care Ambulation Normal     Chief Complaint:  low back pain and headaches ---- pt wanted complete physical will schedule on the way out.  History of Present Illness: Pt here to re-establish care. Has not been seen in >year. Pt speaks english but keeps saying she stopped HCTZ for fear of long-term concequences of use. In discussion it becomes apparent that patient has confused HCTZ with hydrocortisone and someone in [place] told her this was dangerous to take long-term. She notes recurrent headaches ,more so in mornings.No CP,syncope,or dizziness. She also c/o left side pain.No CVAT. Pain is intermittent and sounds positional. Not related to nausea,vomiting,diarrhea,or constipation. Pt does feel that may be connected to excessive gas.  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    Speaks Arabic as first language/speaks english    son age 4   Risk Factors:  Tobacco use:  never Drug use:  no Alcohol use:  no   Review of Systems  The patient denies chest pain, syncope, dyspnea on exhertion,  peripheral edema, and prolonged cough.         Has occ headaches;some in AM. No dizziness.   Physical Exam  General:     Well-developed,well-nourished,in no acute distress; alert,appropriate and cooperative throughout examination Taditional dress. Mouth:       Neck:     No deformities, masses, or tenderness noted. Lungs:     Normal respiratory effort, chest expands symmetrically. Lungs are clear to auscultation, no crackles or wheezes. Heart:     Normal rate and regular rhythm. S1 and S2 normal without gallop, murmur, click, rub or other extra sounds. Abdomen:     Bowel sounds positive,abdomen soft and non-tender without masses, organomegaly or hernias noted. Extremities:     No clubbing, cyanosis, edema.      Impression & Recommendations:  Problem # 1:  HYPERTENSION (ICD-401.9) MD educated patient on diagnosis and medication compliance Her updated medication list for this problem includes:    Hydrochlorothiazide 12.5 Mg Tabs (Hydrochlorothiazide) .Marland Kitchen... Take 1 tab  by mouth every morning   Complete Medication List: 1)  Hydrochlorothiazide 12.5 Mg Tabs (Hydrochlorothiazide) .... Take 1 tab  by mouth every morning  Patient Instructions: 1)  See nurse tomorrow for fasting labs and to check your old medicine bottle. 2)  Check FLP,CMET,CBC,TSH (ICD9 401.1)    Prescriptions: HYDROCHLOROTHIAZIDE 12.5 MG  TABS (HYDROCHLOROTHIAZIDE) Take 1 tab  by mouth every morning  #30 x 2   Entered and Authorized by:   Beverley Fiedler MD   Signed by:   Beverley Fiedler MD on 10/17/2007   Method used:   Print then Give to Patient   RxID:   [id number]  ]

## 2011-01-06 NOTE — Assessment & Plan Note (Signed)
Summary: low back pain/hematuria   Vital Signs:  Patient Profile:   40 Years Old Female Height:     61 inches Temp:     98.8 degrees F oral Pulse rate:   80 / minute Pulse rhythm:   regular Resp:     20 per minute BP sitting:   110 / 70  (left arm) Cuff size:   regular  Pt. in pain?   yes    Location:   lower back    Intensity:   5    Type:       sharp  Vitals Entered By: Levon Hedger (December 23, 2007 2:33 PM)              Is Patient Diabetic? No  Does patient need assistance? Ambulation Normal Comments pt did not bring medications today     Chief Complaint:  left side Back Pain.  History of Present Illness:  Pt notes that she has pain in her lower back.  Notes that when she stands or sits for long period of time she feels the pain.  Pain started about 2 months ago.  Pt is intermittent however it is never totally gone. Denies any heavy lifting. No pain with urination. Pt does not take any medication for her back pain. Regular menses.  pt notes that there is no change in back pain when menses starts.  She came into the office 2 months ago for similar complaints.  She was given blood pressure meds at that time. Pt would also like results of labs done at last visit.  Pt notes that she is concerned that this pain is "in her kidneys" because of the blood pressure meds she takes  Hypertension History:      She denies headache, chest pain, and palpitations.  She notes problems with antihypertensive medication side effects.        Positive major cardiovascular risk factors include hypertension.  Negative major cardiovascular risk factors include female age less than 5 years old and non-tobacco-user status.     Current Allergies: No known allergies     Risk Factors: Tobacco use:  never Drug use:  no Alcohol use:  no   Review of Systems  General      Denies chills, fatigue, and fever.  ENT      Denies earache and sore throat.  CV      Denies chest pain  or discomfort, fatigue, and shortness of breath with exertion.  Resp      Denies cough and shortness of breath.  GI      Denies abdominal pain, constipation, nausea, and vomiting.  MS      left lower back pain   Physical Exam  General:     alert.   Head:     normocephalic.  covered Eyes:     pupils round.   Ears:     R ear normal and L ear normal.   Nose:     no external deformity.   Lungs:     normal respiratory effort, no intercostal retractions, and no accessory muscle use.   Heart:     normal rate, regular rhythm, and no murmur.   Abdomen:     soft, non-tender, and normal bowel sounds.   Left  flank tenderness Msk:     up to exam table, no limits Neurologic:      alert & oriented X3.   Psych:     Oriented X3.     Detailed  Back/Spine Exam  Lumbosacral Exam:  Inspection-deformity:    Normal Palpation-spinal tenderness:  Abnormal    tenderness with palpation of left flank Sitting Straight Leg Raise:    Right:  negative    Left:  negative    Impression & Recommendations:  Problem # 1:  BACK PAIN, LUMBAR (ICD-724.2) handout given Her updated medication list for this problem includes:    Ibuprofen 600 Mg Tabs (Ibuprofen) .Marland Kitchen... 1 tablet by mouth two times a day as needed for pain  Orders: UA Dipstick w/o Micro (81002)   Problem # 2:  HEMATURIA UNSPECIFIED (ICD-599.70) will repeat urine in 10 days. if still positive then will need ct scam. Her updated medication list for this problem includes:    Macrobid 100 Mg Caps (Nitrofurantoin monohyd macro) .Marland Kitchen... 1 tablet by mouth two times a day for infection  Orders: T-Culture, Urine ([address])   Problem # 3:  HYPERTENSION (ICD-401.9) continue current meds Her updated medication list for this problem includes:    Hydrochlorothiazide 12.5 Mg Tabs (Hydrochlorothiazide) .Marland Kitchen... Take 1 tab  by mouth every morning  Orders: UA Dipstick w/o Micro ([address]1002)   Complete Medication List: 1)   Hydrochlorothiazide 12.5 Mg Tabs (Hydrochlorothiazide) .... Take 1 tab  by mouth every morning 2)  Ibuprofen 600 Mg Tabs (Ibuprofen) .Marland Kitchen.. 1 tablet by mouth two times a day as needed for pain 3)  Macrobid 100 Mg Caps (Nitrofurantoin monohyd macro) .Marland Kitchen.. 1 tablet by mouth two times a day for infection  Hypertension Assessment/Plan:      The patient's hypertensive risk group is category A: No risk factors and no target organ damage.  Her calculated 10 year risk of coronary heart disease is 1 %.  Today's blood pressure is 110/70.  Her blood pressure goal is < 140/90.   Patient Instructions: 1)  Back pain may be due to infection. 2)  Read handout.  take antibiotics 3)  May use warm compresses or heat pad 4)  Ibuprofen two times a day as needed for pain (take with food) 5)  Follow up in 10 days for repeat urine.  If still with blood will need ct scan of kidneys    Prescriptions: MACROBID 100 MG  CAPS (NITROFURANTOIN MONOHYD MACRO) 1 tablet by mouth two times a day for infection  #14 x 0   Entered and Authorized by:   Lehman Prom FNP   Signed by:   Lehman Prom FNP on 12/23/2007   Method used:   Print then Give to Patient   RxID:   [id number] IBUPROFEN 600 MG  TABS (IBUPROFEN) 1 tablet by mouth two times a day as needed for pain  #50 x 0   Entered and Authorized by:   Lehman Prom FNP   Signed by:   Lehman Prom FNP on 12/23/2007   Method used:   Print then Give to Patient   RxID:   [id number]  ]  Appended Document: low back pain/hematuria      Current Allergies: No known allergies         Complete Medication List: 1)  Hydrochlorothiazide 12.5 Mg Tabs (Hydrochlorothiazide) .... Take 1 tab  by mouth every morning 2)  Ibuprofen 600 Mg Tabs (Ibuprofen) .Marland Kitchen.. 1 tablet by mouth two times a day as needed for pain 3)  Macrobid 100 Mg Caps (Nitrofurantoin monohyd macro) .Marland Kitchen.. 1 tablet by mouth two times a day for infection     ] Laboratory Results     Urine Tests  Date/Time Received: December 23, 2007  4:09 PM  Date/Time Reported: December 23, 2007 4:09 PM   Routine Urinalysis   Color: yellow Appearance: Clear Glucose: negative   (Normal Range: Negative) Bilirubin: negative   (Normal Range: Negative) Ketone: negative   (Normal Range: Negative) Spec. Gravity: 1.020   (Normal Range: 1.003-1.035) Blood: moderate   (Normal Range: Negative) pH: 5.0   (Normal Range: 5.0-8.0) Protein: negative   (Normal Range: Negative) Urobilinogen: 0.2   (Normal Range: 0-1) Nitrite: negative   (Normal Range: Negative) Leukocyte Esterace: negative   (Normal Range: Negative)

## 2011-01-06 NOTE — Letter (Signed)
Summary: Handout Printed  Printed Handout:  - Plantar Wart 

## 2011-01-06 NOTE — Assessment & Plan Note (Signed)
Summary: Acute - Back pain   Vital Signs:  Patient Profile:   40 Years Old Female Height:     61 inches Weight:      153 pounds BMI:     29.01 BSA:     1.69 Temp:     98.2 degrees F oral Pulse rate:   88 / minute Pulse rhythm:   regular Resp:     20 per minute BP sitting:   128 / 76  (left arm) Cuff size:   regular  Pt. in pain?   yes    Location:   side  Vitals Entered By: Levon Hedger (January 31, 2008 11:10 AM)              Is Patient Diabetic? No  Does patient need assistance? Ambulation Normal Comments pt brought medications today     Chief Complaint:  2 week follow-up/ CTscan.  History of Present Illness:  Pt into the office for CT and x-ray results. Pt still has back pain though it is mainly in her left side.  She has been using the ibuprofen but she reports that her sister has instructed her not to use because it may cuase kidney problems.  She does admit that when she takes the ibuprofen that the pain improves. Pt reports that she prays 5 times per day.  she does lots of bending. No dysuria or hematuria. no rash or blisters in area of pain.  Rash - she was given hydrocortisone cream on her face during the last visit.   She reports that it is not doing any good.  She reports that many people from her country has this skin disease.    Current Allergies: No known allergies     Risk Factors: Tobacco use:  never Drug use:  no Alcohol use:  no   Review of Systems  CV      Denies chest pain or discomfort.  GI      Denies abdominal pain, nausea, and vomiting.  MS      Complains of low back pain.      left  Derm      Complains of rash.   Physical Exam  General:     alert.   Head:     normocephalic.  covered Eyes:     pupils round.   Ears:     R ear normal and L ear normal.   Nose:     nose piercing noted.   Lungs:     normal respiratory effort, no intercostal retractions, no accessory muscle use, and normal breath sounds.     Heart:     normal rate, regular rhythm, no murmur, and no gallop.   Abdomen:     soft, non-tender, and normal bowel sounds.   Msk:     tenderness with palpation of left lower paraspinal muscles Active ROM Skin:     no macules or papules on face but some areas of hypopigmentation Psych:     Oriented X3.      Impression & Recommendations:  Problem # 1:  BACK PAIN, LUMBAR (ICD-724.2) Will refer to physical therapy. CT results negative for kidney stone. may take ibuprofen prn Her updated medication list for this problem includes:    Ibuprofen 600 Mg Tabs (Ibuprofen) .Marland Kitchen... 1 tablet by mouth two times a day as needed for pain  Orders: Physical Therapy Referral (PT)   Problem # 2:  DERMATITIS (ICD-692.9) pt does not seen any improvment with hydrocortisone cream.  Denied offer for nizoral shampoo. Her updated medication list for this problem includes:    Hydrocortisone 2.5 % Oint (Hydrocortisone) .Marland Kitchen... 1 application to affected area daily   Problem # 3:  HYPERTENSION (ICD-401.9) continue current meds. Her updated medication list for this problem includes:    Hydrochlorothiazide 12.5 Mg Tabs (Hydrochlorothiazide) .Marland Kitchen... Take 1 tab  by mouth every morning   Complete Medication List: 1)  Hydrochlorothiazide 12.5 Mg Tabs (Hydrochlorothiazide) .... Take 1 tab  by mouth every morning 2)  Ibuprofen 600 Mg Tabs (Ibuprofen) .Marland Kitchen.. 1 tablet by mouth two times a day as needed for pain 3)  Hydrocortisone 2.5 % Oint (Hydrocortisone) .Marland Kitchen.. 1 application to affected area daily   Patient Instructions: 1)  This office will refer you to physical therapy for your back. 2)  Also let me know what cream your friend has used for her facial discoloration.    ]

## 2011-01-06 NOTE — Assessment & Plan Note (Signed)
Summary: Wart/HTN   Vital Signs:  Patient profile:   40 year old female Weight:      146.5 pounds Pulse rate:   76 / minute Pulse rhythm:   regular Resp:     20 per minute BP sitting:   167 / 97  (left arm) Cuff size:   regular  Vitals Entered By: Levon Hedger (September 02, 2009 11:44 AM) CC: growth on right index finger that keeps growing and growin, Hypertension Management Pain Assessment Patient in pain? yes     Location: tooth Intensity: 5  Does patient need assistance? Functional Status Self care Ambulation Normal   CC:  growth on right index finger that keeps growing and growin and Hypertension Management.  History of Present Illness:  Pt into the office with c/o right finger problems. She has noticed the area for more than 2 months. It is getting larger in size. No pain no discoloration right index finger is the only one affected   Hypertension History:      She denies headache, chest pain, and palpitations.  She notes no problems with any antihypertensive medication side effects.        Positive major cardiovascular risk factors include hypertension.  Negative major cardiovascular risk factors include female age less than 25 years old and non-tobacco-user status.        Further assessment for target organ damage reveals no history of ASHD, cardiac end-organ damage (CHF/LVH), stroke/TIA, peripheral vascular disease, renal insufficiency, or hypertensive retinopathy.     Allergies (verified): No Known Drug Allergies  Review of Systems CV:  Denies chest pain or discomfort. Resp:  Denies cough. GI:  Denies bloody stools, nausea, and vomiting; right lower molar - broken. Derm:  Complains of lesion(s); right index finger.  Physical Exam  General:  alert.   Head:  normocephalic.   Msk:  active ROM Neurologic:  alert & oriented X3.   Skin:  .5mm papule on right index finger Psych:  Oriented X3.     Impression & Recommendations:  Problem # 1:  WART,  RIGHT HAND (ICD-078.10) advised pt to use OTC preparation  Problem # 2:  HYPERTENSION (ICD-401.9) BP is elevated today. Her updated medication list for this problem includes:    Lisinopril 5 Mg Tabs (Lisinopril) ..... One tablet by mouth daily for blood pressure **pharmacy - discontinue hctz**  Problem # 3:  DENTAL DISORDER (ICD-525.9) free dental clinic handout given to pt advised her that dental clinic is not accepting referrals at this time  Complete Medication List: 1)  Lisinopril 5 Mg Tabs (Lisinopril) .... One tablet by mouth daily for blood pressure **pharmacy - discontinue hctz** 2)  Ibuprofen 600 Mg Tabs (Ibuprofen) .Marland Kitchen.. 1 tablet by mouth two times a day as needed for pain 3)  Solage 2-0.01 % Soln (Mequinol-tretinoin) .Marland Kitchen.. 1 application topically two times a day to affected area 4)  Ecotrin Low Strength 81 Mg Tbec (Aspirin) .Marland Kitchen.. 1 tablet by mouth daily  Hypertension Assessment/Plan:      The patient's hypertensive risk group is category A: No risk factors and no target organ damage.  Her calculated 10 year risk of coronary heart disease is 2 %.  Today's blood pressure is 167/97.  Her blood pressure goal is < 140/90.  Patient Instructions: 1)  Use over the counter preparation for Warts - Wart removal 2)  You will need to use this for several weeks (4-6) for resolution 3)  Return in 2 weeks for a blood pressure check. 4)  Keep a blood pressure log and bring with you to the next visit. 5)  Take your blood pressure medications before this visit.

## 2011-01-06 NOTE — Progress Notes (Signed)
Summary: pain in lower area, missed wed appointment  Phone Note Call from Patient Call back at Home Phone [phone]   Caller: Patient Summary of Call: suffering of pain lower area per couple weeks.  She wants to come for an office visit. Dr Barbaraann Barthel Initial call taken by: Manon Hilding,  September 09, 2007 12:09 PM  Follow-up for Phone Call        OK to schedule appointment for patient at next available time slot Follow-up by: Vesta Mixer CMA,  September 15, 2007 10:13 AM  Additional Follow-up for Phone Call Additional follow up Details #1::        LEF A MESSAGE FOR HER TO CALL ME BACK. Additional Follow-up by: Manon Hilding,  September 15, 2007 4:53 PM    Additional Follow-up for Phone Call Additional follow up Details #2::    The patient will come on Wed oct 27 at 3:15 Pt missed the last appointment, she was working, now states she is still in pain, advised her we had no appointment to offer until 10/27, explained we were operating at full schedual, advised her to go to [facility] if she needed to / pt was very demanding on phone, was upset because we could not work her in sooner / S.Christell Constant, CMA  Follow-up by: Manon Hilding,  September 16, 2007 9:18 AM  Additional Follow-up for Phone Call Additional follow up Details #3:: Details for Additional Follow-up Action Taken: NOTED Additional Follow-up by: Mikey College CMA,  September 16, 2007 2:57 PM

## 2011-01-06 NOTE — Assessment & Plan Note (Signed)
Summary: Hematuria/HTN   Vital Signs:  Patient Profile:   40 Years Old Female Height:     61 inches Weight:      152 pounds BMI:     28.82 BSA:     1.68 Temp:     97.9 degrees F oral Pulse rate:   80 / minute Pulse rhythm:   regular Resp:     20 per minute BP sitting:   130 / 80  (left arm) Cuff size:   regular  Pt. in pain?   yes  Vitals Entered By: Levon Hedger (January 02, 2008 10:49 AM)              Is Patient Diabetic? No  Does patient need assistance? Ambulation Normal     Chief Complaint:  10 day follow-up on urine and meds .  History of Present Illness:  Pt seen about 10 days ago with hematuria.  She was treated with antibiotics. Urine sent for culture.  Pt reports that she took all the antibiotics as ordered.  Pt does still have some pain in her left flank pain.  She does not see blood in her urine. no fever or dysuria. She does take ASA 325mg  daily.  HTN - pt is asking if she will need her blood pressure for the rest of her life. Pt notes that she is trying to lower her weight.  Pt does have a blood pressure machine at  but she does not regularly check her bp.    Face with some discoloration. hypopigmented areas.  no itching.  gradually present for past year. Pt has not applied anything this area.    Current Allergies: No known allergies     Risk Factors: Tobacco use:  never Drug use:  no Alcohol use:  no   Review of Systems  General      Denies chills, fatigue, and fever.  ENT      Denies earache and sore throat.  CV      Denies chest pain or discomfort, fatigue, and shortness of breath with exertion.  Resp      Denies cough and shortness of breath.  GI      Denies abdominal pain, diarrhea, nausea, and vomiting.      left flank pain   Physical Exam  General:     alert.   Head:     normocephalic.  glasses Eyes:     pupils round.   Ears:     R ear normal and L ear normal.   Nose:     no external deformity.   Mouth:   fair dentition.   Lungs:     normal respiratory effort, no intercostal retractions, and no accessory muscle use.   Heart:     normal rate, regular rhythm, and no murmur.   Abdomen:     soft and non-tender.   left flank tenderness with palpation Msk:     active range of motion Neurologic:     alert & oriented X3.   Skin:     bil cheeks with hypopigmented area Psych:     Oriented X3.      Impression & Recommendations:  Problem # 1:  HEMATURIA UNSPECIFIED (ICD-599.70) u/a still with moderate blood today. Will send for CT scan. The following medications were removed from the medication list:    Macrobid 100 Mg Caps (Nitrofurantoin monohyd macro) .Marland Kitchen... 1 tablet by mouth two times a day for infection  Orders: UA Dipstick w/o Micro (81002) CT  without Contrast (CT w/o contrast)   Problem # 2:  HYPERTENSION (ICD-401.9) Pt advised to lose weight to see if she can come off meds. Her updated medication list for this problem includes:    Hydrochlorothiazide 12.5 Mg Tabs (Hydrochlorothiazide) .Marland Kitchen... Take 1 tab  by mouth every morning   Problem # 3:  DERMATITIS (ICD-692.9)  Her updated medication list for this problem includes:    Hydrocortisone 2.5 % Oint (Hydrocortisone) .Marland Kitchen... 1 application to affected area daily   Complete Medication List: 1)  Hydrochlorothiazide 12.5 Mg Tabs (Hydrochlorothiazide) .... Take 1 tab  by mouth every morning 2)  Ibuprofen 600 Mg Tabs (Ibuprofen) .Marland Kitchen.. 1 tablet by mouth two times a day as needed for pain 3)  Hydrocortisone 2.5 % Oint (Hydrocortisone) .Marland Kitchen.. 1 application to affected area daily   Patient Instructions: 1)  Follow up in 2 weeks after CT scan for results. 2)  if negative consider stopping aspirin    Prescriptions: HYDROCORTISONE 2.5 %  OINT (HYDROCORTISONE) 1 application to affected area daily  #60gm x 0   Entered and Authorized by:   Lehman Prom FNP   Signed by:   Lehman Prom FNP on 01/02/2008   Method used:   Print then  Give to Patient   RxID:   [id number]  ] Laboratory Results   Urine Tests  Date/Time Recieved: January 02, 2008 2:15 PM    Routine Urinalysis   Color: lt. yellow Glucose: negative   (Normal Range: Negative) Bilirubin: negative   (Normal Range: Negative) Ketone: negative   (Normal Range: Negative) Spec. Gravity: 1.010   (Normal Range: 1.003-1.035) Blood: moderate   (Normal Range: Negative) pH: 6.0   (Normal Range: 5.0-8.0) Protein: negative   (Normal Range: Negative) Urobilinogen: 0.2   (Normal Range: 0-1) Nitrite: negative   (Normal Range: Negative) Leukocyte Esterace: negative   (Normal Range: Negative)

## 2011-01-06 NOTE — Letter (Signed)
Summary: *HSN Results Follow up  [facility]  [address] [place], [place] [address]   Phone: [phone]  Fax: [phone]      12/16/2009   VADA SWIFT [facility] [address] [place], [place]  [address]   Dear  Ms. Taelynn Hiscox,                            ____S.Drinkard,FNP   ____D. Gore,FNP       ____B. McPherson,MD   ____V. Rankins,MD    ____E. Mulberry,MD    __X__N. Daphine Deutscher, FNP  ____D. Reche Dixon, MD    ____K. Philipp Deputy, MD    ____Other     This letter is to inform you that your recent test(s):  ___X____Pap Smear    ___X____Lab Test     _______X-ray    _______ is within acceptable limits  _______ requires a medication change  _______ requires a follow-up lab visit  _______ requires a follow-up visit with your provider   Comments: Labs done during your recent office visit shows that your kidneys have been working a little too hard because of your uncontrolled high blood pressure.  This should stablize since you are taking blood pressure medication and your blood pressure is better. Your urine sample does have microscopic blood and it was also there during your last physical exam You had a CT of the abdomen and pelvis done at that time which was normal.  Since there is still trace blood in your urine I would recommend addition testing BUT YOU WILL NEED TO SCHEDULE AND OFFICE VISIT TO DISCUSS.  Pap Smear results ___________________________________.   _________________________________________________________ If you have any questions, please contact our office [phone].                    Sincerely,   Lehman Prom FNP [facility]

## 2011-01-06 NOTE — Assessment & Plan Note (Signed)
Summary: Complete Physical Exam   Vital Signs:  Patient profile:   40 year old female Menstrual status:  regular LMP:     11/28/2009 Weight:      145.6 pounds BMI:     27.61 BSA:     1.65 Temp:     97.9 degrees F oral Pulse rate:   75 / minute Pulse rhythm:   regular Resp:     20 per minute BP sitting:   125 / 81  (left arm) Cuff size:   regular  Vitals Entered By: Levon Hedger (December 13, 2009 2:18 PM) CC: CPP, Hypertension Management Is Patient Diabetic? No Pain Assessment Patient in pain? yes     Location: left side  Does patient need assistance? Functional Status Self care Ambulation Normal LMP (date): 11/28/2009     Menstrual flow (days): 5 Menstrual Status regular Enter LMP: 11/28/2009   CC:  CPP and Hypertension Management.  History of Present Illness:  Pt into the office for a complete physical exam  Mammogram - Never had a mammogram.  No self breast checks at home. No family hx of breast cancer  PAP - Last PAP Smear was over 1 year ago Married   Optho - wears glasses and last optho visit was 1 month ago with retasure. Aware that she needs optho appt  Dental - last dental exam was over 1 year ago. She does have tooth that is broken and she need dental care tdap - over 13 year ago when her son was born  Hypertension History:      She denies headache, chest pain, and palpitations.  She notes no problems with any antihypertensive medication side effects.        Positive major cardiovascular risk factors include hypertension.  Negative major cardiovascular risk factors include female age less than 8 years old and non-tobacco-user status.        Further assessment for target organ damage reveals no history of ASHD, cardiac end-organ damage (CHF/LVH), stroke/TIA, peripheral vascular disease, renal insufficiency, or hypertensive retinopathy.     Habits & Providers  Alcohol-Tobacco-Diet     Alcohol drinks/day: 0     Tobacco Status:  never  Exercise-Depression-Behavior     Does Patient Exercise: no     Have you felt down or hopeless? no     Have you felt little pleasure in things? no     Drug Use: no  Comments: PHQ-9 score = 2  Allergies (verified): No Known Drug Allergies  Social History: Does Patient Exercise:  no  Review of Systems General:  Complains of fatigue; denies fever; since starting the atenolol.  improved when she started to take it later in the day. Eyes:  Denies blurring. ENT:  Denies earache. CV:  Denies chest pain or discomfort. Resp:  Denies cough. GI:  Denies abdominal pain, nausea, and vomiting. GU:  Denies discharge. MS:  Denies joint pain. Derm:  Denies rash. Neuro:  Denies headaches. Psych:  Denies anxiety and depression.  Physical Exam  General:  alert.   Head:  normocephalic.   Eyes:  vision grossly intact, pupils equal, pupils round, strabismus, and left pterygium.   Ears:  bil TM with bony landmarks presnt Nose:  no nasal discharge.   Mouth:  fair dentition.   Neck:  supple.   Chest Wall:  no mass.   Breasts:  skin/areolae normal, no masses, and no abnormal thickening.   Lungs:  normal breath sounds.   Heart:  normal rate and regular  rhythm.   Abdomen:  soft, non-tender, and normal bowel sounds.   Rectal:  defer Msk:  normal ROM.   Extremities:  no edema Neurologic:  alert & oriented X3.   Skin:  color normal.   Psych:  Oriented X3.     Impression & Recommendations:  Problem # 1:  ROUTINE GYNECOLOGICAL EXAMINATION (ICD-V72.31) labs done  PAP done self breast exam placard given rec optho and dental exams declined flu vaccine Orders: T-CBC w/Diff (45409-81191) Rapid HIV  (47829) T-TSH (56213-08657) TLB-Hepatic/Liver Function Pnl (80076-HEPATIC) KOH/ WET Mount [phone]) Pap Smear, Thin Prep ( Collection of) (E9528) T- GC Chlamydia (41324)  Problem # 2:  HYPERTENSION (ICD-401.9) Assessment: Unchanged BP stable Continue current meds  DASH diet Her  updated medication list for this problem includes:    Hydrochlorothiazide 25 Mg Tabs (Hydrochlorothiazide) .Marland Kitchen... Take one (1) by mouth daily    Atenolol 25 Mg Tabs (Atenolol) ..... One tablet by mouth daily for blood pressure  Orders: EKG w/ Interpretation (93000) T-CBC w/Diff (40102-72536) TLB-Hepatic/Liver Function Pnl (80076-HEPATIC) T-Urine Microalbumin w/creat. ratio [phone]) UA Dipstick w/o Micro (manual) (87564)  Complete Medication List: 1)  Ibuprofen 600 Mg Tabs (Ibuprofen) .Marland Kitchen.. 1 tablet by mouth two times a day as needed for pain 2)  Solage 2-0.01 % Soln (Mequinol-tretinoin) .Marland Kitchen.. 1 application topically two times a day to affected area 3)  Ecotrin Low Strength 81 Mg Tbec (Aspirin) .Marland Kitchen.. 1 tablet by mouth daily 4)  Hydrochlorothiazide 25 Mg Tabs (Hydrochlorothiazide) .... Take one (1) by mouth daily 5)  Atenolol 25 Mg Tabs (Atenolol) .... One tablet by mouth daily for blood pressure  Other Orders: Tdap => 73yrs IM (33295) Admin 1st Vaccine (18841) Admin 1st Vaccine [facility] [place]) [phone])  Hypertension Assessment/Plan:      The patient's hypertensive risk group is category A: No risk factors and no target organ damage.  Her calculated 10 year risk of coronary heart disease is 1 %.  Today's blood pressure is 125/81.  Her blood pressure goal is < 140/90.  Patient Instructions: 1)  Follow up as needed Prescriptions: ATENOLOL 25 MG TABS (ATENOLOL) One tablet by mouth daily for blood pressure  #30 x 5   Entered and Authorized by:   Lehman Prom FNP   Signed by:   Lehman Prom FNP on 12/13/2009   Method used:   Print then Give to Patient   RxID:   [id number] HYDROCHLOROTHIAZIDE 25 MG TABS (HYDROCHLOROTHIAZIDE) Take one (1) by mouth daily  #30 x 5   Entered and Authorized by:   Lehman Prom FNP   Signed by:   Lehman Prom FNP on 12/13/2009   Method used:   Print then Give to Patient   RxID:   [id number]    Prevention & Chronic Care Immunizations    Influenza vaccine: Not documented   Influenza vaccine deferral: Refused  (12/13/2009)    Tetanus booster: 12/13/2009: Tdap    Pneumococcal vaccine: Not documented  Other Screening   Pap smear: Not documented   Pap smear action/deferral: Ordered  (12/13/2009)   Pap smear due: 12/13/2010   Smoking status: never  (12/13/2009)  Lipids   Total Cholesterol: 193  (10/18/2007)   LDL: 115  (10/18/2007)   LDL Direct: Not documented   HDL: 54  (10/18/2007)   Triglycerides: 119  (10/18/2007)  Hypertension   Last Blood Pressure: 125 / 81  (12/13/2009)   Serum creatinine: 0.67  (11/13/2009)   Serum potassium 3.8  (11/13/2009)  Self-Management Support :    Hypertension self-management  support: Not documented   Nursing Instructions: Give tetanus booster today    Tetanus/Td Vaccine    Vaccine Type: Tdap    Site: right deltoid    Mfr: Sanofi Pasteur    Dose: 0.5 ml    Route: IM    Given by: Levon Hedger    Exp. Date: 02/27/2012    Lot #: [id number]    VIS given: 10/25/07 version given December 13, 2009.   Laboratory Results   Urine Tests  Date/Time Received: December 13, 2009 4:54 PM  Date/Time Reported: December 13, 2009 4:54 PM   Routine Urinalysis   Color: lt. yellow Appearance: Clear Glucose: negative   (Normal Range: Negative) Bilirubin: negative   (Normal Range: Negative) Ketone: negative   (Normal Range: Negative) Spec. Gravity: 1.010   (Normal Range: 1.003-1.035) Blood: small   (Normal Range: Negative) pH: 6.5   (Normal Range: 5.0-8.0) Protein: negative   (Normal Range: Negative) Urobilinogen: 0.2   (Normal Range: 0-1) Nitrite: negative   (Normal Range: Negative) Leukocyte Esterace: negative   (Normal Range: Negative)    Date/Time Received: December 13, 2009 5:43 PM   Wet Mount/KOH Source: vaginal WBC/hpf: 1-5 Bacteria/hpf: rare Clue cells/hpf: none Yeast/hpf: none Trichomonas/hpf: none  Other Tests  Rapid HIV: negative   Laboratory Results    Urine Tests    Routine Urinalysis   Color: lt. yellow Appearance: Clear Glucose: negative   (Normal Range: Negative) Bilirubin: negative   (Normal Range: Negative) Ketone: negative   (Normal Range: Negative) Spec. Gravity: 1.010   (Normal Range: 1.003-1.035) Blood: small   (Normal Range: Negative) pH: 6.5   (Normal Range: 5.0-8.0) Protein: negative   (Normal Range: Negative) Urobilinogen: 0.2   (Normal Range: 0-1) Nitrite: negative   (Normal Range: Negative) Leukocyte Esterace: negative   (Normal Range: Negative)      Wet [facility] KOH: Negative  Other Tests  Rapid HIV: negative      EKG  Procedure date:  12/13/2009  Findings:      NSR

## 2011-01-06 NOTE — Assessment & Plan Note (Signed)
Summary: Hematuria/Back pain   Vital Signs:  Patient Profile:   40 Years Old Female LMP:     01/2008 Height:     61 inches Weight:      153 pounds BMI:     29.01 BSA:     1.69 Temp:     97.3 degrees F oral Pulse rate:   80 / minute Resp:     20 per minute BP sitting:   138 / 80  (left arm) Cuff size:   regular  Pt. in pain?   yes    Location:   left side, eyes  Vitals Entered By: Levon Hedger (January 17, 2008 9:11 AM)  Menstrual History: LMP (date): 01/2008              Is Patient Diabetic? No  Does patient need assistance? Ambulation Normal Comments pt has an interpreter,      Chief Complaint:  2 week follow-up/ CT scan.  History of Present Illness:  Pt into the office to for results of her CT scan. She reports that she has pain on her left side daily.  she notes that the pain is constant. she still takes ibuprofen which does help some with the pain.  She has been told and treated for muscle spasms in this area previously.   Pt is being worked up for hematuria.  HTN - pt did not take her blood pressure today.  She notes that she takes her blood pressure every morning.  She notes that she did not take the meds today because it causes her to go to the bathroom.  She notes that she is adjusting the dose of the meds.  Admits that she is takes the meds only if she feels she needs it.         Back Pain History:      The patient's back pain has been present for > 6 weeks.  The pain is located in the lower back region and does not radiate below the knees.  She states this is not work related.  On a scale of 1-10, she describes the pain as a 5.  She states that she has had a prior history of back pain.  The patient has not had any recent physical therapy for her back pain.  The following makes the back pain better: rest, ibuprofen.  The following makes the back pain worse: activity.    Hypertension History:      She denies headache, chest pain, and palpitations.   She notes no problems with any antihypertensive medication side effects.  Further comments include: pt is taking her meds only when her blood pressure is elevated.        Positive major cardiovascular risk factors include hypertension.  Negative major cardiovascular risk factors include female age less than 26 years old and non-tobacco-user status.      Current Allergies: No known allergies     Risk Factors: Tobacco use:  never Drug use:  no Alcohol use:  no   Review of Systems  General      Denies chills, fatigue, and fever.  Eyes      Denies discharge.  ENT      Denies earache and sore throat.  CV      Denies chest pain or discomfort and fatigue.  Resp      Denies chest pain with inspiration and shortness of breath.  GI      Denies abdominal pain, nausea, and vomiting.  MS  left flank/hip pain   Physical Exam  General:     alert.   Head:     normocephalic.   Eyes:     pupils round.   Ears:     R ear normal and L ear normal.   Nose:     no external deformity.   Mouth:     fair dentition.   Lungs:     normal respiratory effort, no intercostal retractions, no accessory muscle use, and normal breath sounds.   Heart:     normal rate, regular rhythm, and no murmur.   Abdomen:     soft, non-tender, and normal bowel sounds.   Msk:     left flank tenderness with palpation active ROM up to the exam table Neurologic:     alert & oriented X3.    Low Back Pain Physical Exam:    Inspection-deformity:     No    Palpation-spinal tenderness:   No    Straight Leg Raise (SLR):       Left Straight Leg Raise (SLR):   negative       Right Straight Leg Raise (SLR):   negative    Impression & Recommendations:  Problem # 1:  HEMATURIA UNSPECIFIED (ICD-599.70) Ct results reviewed with pt.   Will schedule for Ct with constrast Orders: CT with Contrast (CT w/ contrast)   Problem # 2:  BACK PAIN, LUMBAR (ICD-724.2) Will send for x-rays Her updated  medication list for this problem includes:    Ibuprofen 600 Mg Tabs (Ibuprofen) .Marland Kitchen... 1 tablet by mouth two times a day as needed for pain  Orders: Radiology other (Radiology Other)   Problem # 3:  HYPERTENSION (ICD-401.9) BP is stable.  Pt needs to take meds daily Her updated medication list for this problem includes:    Hydrochlorothiazide 12.5 Mg Tabs (Hydrochlorothiazide) .Marland Kitchen... Take 1 tab  by mouth every morning   Complete Medication List: 1)  Hydrochlorothiazide 12.5 Mg Tabs (Hydrochlorothiazide) .... Take 1 tab  by mouth every morning 2)  Ibuprofen 600 Mg Tabs (Ibuprofen) .Marland Kitchen.. 1 tablet by mouth two times a day as needed for pain 3)  Hydrocortisone 2.5 % Oint (Hydrocortisone) .Marland Kitchen.. 1 application to affected area daily  Hypertension Assessment/Plan:      The patient's hypertensive risk group is category A: No risk factors and no target organ damage.  Her calculated 10 year risk of coronary heart disease is 1 %.  Today's blood pressure is 138/80.  Her blood pressure goal is < 140/90.   Patient Instructions: 1)  You will be sent for CT with contrast.  Keep this time and date. 2)  You can get back x-rays whenever your schedule allows. 3)  Follow up here in 2 weeks for results.    ]

## 2011-01-06 NOTE — Progress Notes (Signed)
Summary: Office Visit//DEPRESSION SCREENING  Office Visit//DEPRESSION SCREENING   Imported By: Arta Bruce 01/28/2010 11:41:48  _____________________________________________________________________  External Attachment:    Type:   Image     Comment:   External Document

## 2011-01-08 NOTE — Progress Notes (Signed)
Summary: meds refill  Phone Note Refill Request Call back at [phone]   Refills Requested: Medication #1:  ATENOLOL 25 MG TABS One tablet by mouth daily for blood pressure. [facility]   Initial call taken by: Domenic Polite,  December 12, 2010 2:27 PM  Follow-up for Phone Call        [phone] Left message on answer machine for pt. to return call.  Gaylyn Cheers RN  December 12, 2010 3:35 PM     Additional Follow-up for Phone Call Additional follow up Details #1::        PATIENT RETURNED CALLED AND WAS MADE AWARE THAT THE RX WAS SENT TO [facility]. Additional Follow-up by: Leodis Rains,  December 12, 2010 4:13 PM    Prescriptions: ATENOLOL 25 MG TABS (ATENOLOL) One tablet by mouth daily for blood pressure  #90 x 1   Entered by:   Gaylyn Cheers RN   Authorized by:   Lehman Prom FNP   Signed by:   Gaylyn Cheers RN on 12/12/2010   Method used:   Electronically to        [facility] [phone] (retail)       [address]       [place], [place]  [address]       Ph: [phone]       Fax: [phone]   RxID:   [id number]

## 2011-01-30 ENCOUNTER — Encounter (INDEPENDENT_AMBULATORY_CARE_PROVIDER_SITE_OTHER): Payer: Self-pay | Admitting: Nurse Practitioner

## 2011-01-30 ENCOUNTER — Other Ambulatory Visit (HOSPITAL_COMMUNITY)
Admission: RE | Admit: 2011-01-30 | Discharge: 2011-01-30 | Disposition: A | Discharge status: Home / Self Care | Payer: Self-pay | Source: Ambulatory Visit | Attending: Nurse Practitioner | Admitting: Nurse Practitioner

## 2011-01-30 ENCOUNTER — Encounter: Payer: Self-pay | Admitting: Nurse Practitioner

## 2011-01-30 ENCOUNTER — Other Ambulatory Visit: Payer: Self-pay | Admitting: Nurse Practitioner

## 2011-01-30 DIAGNOSIS — Z01419 Encounter for gynecological examination (general) (routine) without abnormal findings: Secondary | ICD-10-CM | POA: Insufficient documentation

## 2011-01-30 LAB — CONVERTED CEMR LAB
ALT: 13 units/L (ref 0–35)
AST: 18 units/L (ref 0–37)
Albumin: 4.1 g/dL (ref 3.5–5.2)
Alkaline Phosphatase: 64 units/L (ref 39–117)
BUN: 16 mg/dL (ref 6–23)
Basophils Absolute: 0 10*3/uL (ref 0.0–0.1)
Basophils Relative: 0 % (ref 0–1)
Bilirubin Urine: NEGATIVE
CO2: 30 meq/L (ref 19–32)
Calcium: 10.1 mg/dL (ref 8.4–10.5)
Chloride: 99 meq/L (ref 96–112)
Creatinine, Ser: 0.6 mg/dL (ref 0.40–1.20)
Eosinophils Absolute: 0 10*3/uL (ref 0.0–0.7)
Eosinophils Relative: 1 % (ref 0–5)
Glucose, Bld: 90 mg/dL (ref 70–99)
Glucose, Urine, Semiquant: NEGATIVE
HCT: 40.5 % (ref 36.0–46.0)
Hemoglobin: 13.5 g/dL (ref 12.0–15.0)
KOH Prep: NEGATIVE
Ketones, urine, test strip: NEGATIVE
Lymphocytes Relative: 45 % (ref 12–46)
Lymphs Abs: 2.2 10*3/uL (ref 0.7–4.0)
MCHC: 33.3 g/dL (ref 30.0–36.0)
MCV: 83.3 fL (ref 78.0–100.0)
Monocytes Absolute: 0.3 10*3/uL (ref 0.1–1.0)
Monocytes Relative: 6 % (ref 3–12)
Neutro Abs: 2.4 10*3/uL (ref 1.7–7.7)
Neutrophils Relative %: 49 % (ref 43–77)
Nitrite: NEGATIVE
Platelets: 204 10*3/uL (ref 150–400)
Potassium: 3.7 meq/L (ref 3.5–5.3)
Protein, U semiquant: NEGATIVE
RBC: 4.86 M/uL (ref 3.87–5.11)
RDW: 13.2 % (ref 11.5–15.5)
Sodium: 141 meq/L (ref 135–145)
Specific Gravity, Urine: 1.01
TSH: 1.47 microintl units/mL (ref 0.350–4.500)
Total Bilirubin: 0.2 mg/dL — ABNORMAL LOW (ref 0.3–1.2)
Total Protein: 7.4 g/dL (ref 6.0–8.3)
Urobilinogen, UA: 0.2
WBC Urine, dipstick: NEGATIVE
WBC: 5 10*3/uL (ref 4.0–10.5)
pH: 7

## 2011-01-30 LAB — CYTOLOGY - PAP

## 2011-02-03 ENCOUNTER — Encounter (INDEPENDENT_AMBULATORY_CARE_PROVIDER_SITE_OTHER): Payer: Self-pay | Admitting: Nurse Practitioner

## 2011-02-03 ENCOUNTER — Telehealth (INDEPENDENT_AMBULATORY_CARE_PROVIDER_SITE_OTHER): Payer: Self-pay | Admitting: Nurse Practitioner

## 2011-02-03 NOTE — Assessment & Plan Note (Signed)
Summary: Complete Physical Exam   Vital Signs:  Patient profile:   40 year old female Menstrual status:  irregular LMP:     12/2010 Weight:      147.0 pounds BMI:     27.88 Temp:     97.4 degrees F oral Pulse rate:   72 / minute Pulse rhythm:   regular Resp:     16 per minute BP sitting:   140 / 80  (left arm) Cuff size:   regular  Vitals Entered By: Levon Hedger (January 30, 2011 11:34 AM)  Nutrition Counseling: Patient's BMI is greater than 25 and therefore counseled on weight management options. CC: CPP, Hypertension Management Is Patient Diabetic? No Pain Assessment Patient in pain? no       Does patient need assistance? Functional Status Self care Ambulation Normal LMP (date): 12/2010     Menstrual flow (days): 5 Menstrual Status irregular Enter LMP: 12/2010 Last PAP Result  Specimen Adequacy: Satisfactory for evaluation.      CC:  CPP and Hypertension Management.  History of Present Illness:  Pt into the office for a complete physical exam  PAP - Last PAP done 1 year ago. Normal results. Menses - still with irregular menses. Unable to recall the exact day of the last period Pt is married with chilldren  Mammogram - never had a mammogram before.  No self breast exam at home  Dental - extractions on last month.  Optho - Last eye exam was 06/2010.  She did get some new glasses  Hypertension History:      She denies headache, chest pain, and palpitations.  She notes no problems with any antihypertensive medication side effects.  Pt is taking blood pressure medication as ordered.  Further comments include: She takes the atenolol at night and sometimes she forgets that night dose of medications.        Positive major cardiovascular risk factors include hypertension.  Negative major cardiovascular risk factors include female age less than 17 years old and non-tobacco-user status.        Further assessment for target organ damage reveals no history of ASHD,  cardiac end-organ damage (CHF/LVH), stroke/TIA, peripheral vascular disease, renal insufficiency, or hypertensive retinopathy.    Habits & Providers  Alcohol-Tobacco-Diet     Alcohol drinks/day: 0     Tobacco Status: never  Exercise-Depression-Behavior     Does Patient Exercise: no     Have you felt down or hopeless? no     Have you felt little pleasure in things? no     Depression Counseling: not indicated; screening negative for depression     Drug Use: no  Comments: PHQ-9 score = 0  Allergies (verified): 1)  ! Lisinopril  Review of Systems General:  Denies loss of appetite. Eyes:  Denies discharge. ENT:  Denies earache. CV:  Denies chest pain or discomfort. Resp:  Denies cough. GI:  Denies abdominal pain, nausea, and vomiting. MS:  Denies joint pain. Derm:  Denies dryness. Neuro:  Denies headaches. Psych:  Denies anxiety and depression.  Physical Exam  General:  alert.   Head:  normocephalic.   Eyes:  glasses Ears:  bil TM with bony landmarks Nose:  no nasal discharge.   Mouth:  fair dentition.   Neck:  supple.   Chest Wall:  no mass.   Breasts:  no abnormal thickening.   Lungs:  normal breath sounds.   Heart:  normal rate and regular rhythm.   Abdomen:  normal bowel sounds.  Msk:  normal ROM.   Pulses:  R radial normal and L radial normal.   Extremities:  no edema Neurologic:  alert & oriented X3.   Skin:  color normal.   Psych:  Oriented X3.    Pelvic Exam  Vulva:      normal appearance.   Urethra and Bladder:      Urethra--normal.   Vagina:      physiologic discharge.   Cervix:      midposition.   Uterus:      smooth.   Adnexa:      nontender bilaterally.      Impression & Recommendations:  Problem # 1:  ROUTINE GYNECOLOGICAL EXAMINATION (ICD-V72.31) PHQ-9 score = 9 rec optho and dental exam Orders: KOH/ WET Mount [phone]) Pap Smear, Thin Prep ( Collection of) (N8295)  Problem # 2:  HYPERTENSION (ICD-401.9) BP elevated today but  was stable on last visit DASH diet Her updated medication list for this problem includes:    Hydrochlorothiazide 25 Mg Tabs (Hydrochlorothiazide) .Marland Kitchen... Take one (1) by mouth daily    Atenolol 25 Mg Tabs (Atenolol) ..... One tablet by mouth daily for blood pressure  Orders: T-Comprehensive Metabolic Panel (62130-86578) T-CBC w/Diff (46962-95284) T-TSH (13244-01027) UA Dipstick w/o Micro (manual) (25366) EKG w/ Interpretation (93000)  Complete Medication List: 1)  Ibuprofen 600 Mg Tabs (Ibuprofen) .Marland Kitchen.. 1 tablet by mouth two times a day as needed for pain 2)  Solage 2-0.01 % Soln (Mequinol-tretinoin) .Marland Kitchen.. 1 application topically two times a day to affected area 3)  Ecotrin Low Strength 81 Mg Tbec (Aspirin) .Marland Kitchen.. 1 tablet by mouth daily 4)  Hydrochlorothiazide 25 Mg Tabs (Hydrochlorothiazide) .... Take one (1) by mouth daily 5)  Atenolol 25 Mg Tabs (Atenolol) .... One tablet by mouth daily for blood pressure  Hypertension Assessment/Plan:      The patient's hypertensive risk group is category A: No risk factors and no target organ damage.  Her calculated 10 year risk of coronary heart disease is 2 %.  Today's blood pressure is 140/80.  Her blood pressure goal is < 140/90.   Patient Instructions: 1)  Blood pressure is high today but it was normal on last visit. Continue to take your medications as ordered.  No change at this time will continue to monitor 2)  You will be notified of your lab results. Cholesterol NOT done today since you were not fasting.  Schedule a lab appointment for cholesterol.  3)  Follow up as needed Prescriptions: ATENOLOL 25 MG TABS (ATENOLOL) One tablet by mouth daily for blood pressure  #90 x 1   Entered and Authorized by:   Lehman Prom FNP   Signed by:   Lehman Prom FNP on 01/30/2011   Method used:   Print then Give to Patient   RxID:   [id number] HYDROCHLOROTHIAZIDE 25 MG TABS (HYDROCHLOROTHIAZIDE) Take one (1) by mouth daily  #90 x 1   Entered  and Authorized by:   Lehman Prom FNP   Signed by:   Lehman Prom FNP on 01/30/2011   Method used:   Print then Give to Patient   RxID:   [id number]    Orders Added: 1)  Est. Patient Level IV [30160] 2)  T-Comprehensive Metabolic Panel [80053-22900] 3)  T-CBC w/Diff [10932-35573] 4)  T-TSH [22025-42706] 5)  UA Dipstick w/o Micro (manual) [81002] 6)  KOH/ WET Mount [87210] 7)  Pap Smear, Thin Prep ( Collection of) [Q0091] 8)  EKG w/ Interpretation [93000]    Laboratory  Results   Urine Tests  Date/Time Received: January 30, 2011 12:03 PM   Routine Urinalysis   Color: lt. yellow Glucose: negative   (Normal Range: Negative) Bilirubin: negative   (Normal Range: Negative) Ketone: negative   (Normal Range: Negative) Spec. Gravity: 1.010   (Normal Range: 1.003-1.035) Blood: moderate   (Normal Range: Negative) pH: 7.0   (Normal Range: 5.0-8.0) Protein: negative   (Normal Range: Negative) Urobilinogen: 0.2   (Normal Range: 0-1) Nitrite: negative   (Normal Range: Negative) Leukocyte Esterace: negative   (Normal Range: Negative)    Date/Time Received: January 30, 2011 2:08 PM  [employer] Source: vaginal WBC/hpf: 1-5 Bacteria/hpf: rare Clue cells/hpf: none Yeast/hpf: none Wet Mount KOH: Negative Trichomonas/hpf: none     EKG  Procedure date:  01/30/2011  Findings:      NSR

## 2011-02-03 NOTE — Progress Notes (Signed)
Summary: Office Visit//DEPRESSION SCREENING  Office Visit//DEPRESSION SCREENING   Imported By: Arta Bruce 01/30/2011 12:31:48  _____________________________________________________________________  External Attachment:    Type:   Image     Comment:   External Document

## 2011-02-12 NOTE — Progress Notes (Signed)
Summary: PAP and Labs  Phone Note Outgoing Call   Summary of Call: unable to send letter - notify pt that labs and PAP done during recent office visit are normal. Initial call taken by: Lehman Prom FNP,  February 03, 2011 6:37 PM  Follow-up for Phone Call        pt informed of above information. Follow-up by: Levon Hedger,  February 04, 2011 10:11 AM

## 2011-04-24 NOTE — Op Note (Signed)
[facility]  Patient:    Jessica Andrade, Jessica Andrade Visit Number: [id number] MRN: [id number]          Service Type: OBS Location: [facility] Attending Physician:  Michaelle Copas Dictated by:   Bing Neighbors Clearance Coots, M.D. Proc. Date: 12/25/01 Admit Date:  12/25/2001   CC:         [facility]   Operative Report  PREOPERATIVE DIAGNOSES:       [redacted] weeks gestation by dates, spontaneous abortion, incomplete.  POSTOPERATIVE DIAGNOSES:      [redacted] weeks gestation by dates, spontaneous abortion, incomplete.  PROCEDURE:                    Suction, dilation and curettage.  SURGEON:                      Charles A. Clearance Coots, M.D.  ANESTHESIA:                   MAC with paracervical block.  ESTIMATED BLOOD LOSS:         200 ml.  COMPLICATIONS:                None.  SPECIMEN:                     Products of conception.  OPERATION PERFORMED:          The patient was brought to the operating room and after satisfactory IV sedation, the legs were brought up in stirrups and the vagina was prepped and draped in the usual sterile fashion. The urinary bladder was emptied of approximately 50 cc of clear urine. A sterile speculum was inserted in the vaginal vault and the cervix was isolated. The cervix was dilated and products of conception could be visualized in the cervical os. Paracervical block of 1% xylocaine approximately 8 ml in each lateral fornix and 4 ml in the anterior lip of the cervix was injected in routine fashion. The anterior lip of the cervix was grasped with a ring forceps and a #10 suction cath was easily introduced into the uterine cavity and all contents were evacuated. The endometrial surface was then curetted with a medium sharp curette and no further products of conception were obtained. All instruments were retired and there was no active bleeding at the conclusion of the procedure and the uterus contracted down quite well  and the endometrial surface was gritty throughout with the curettage procedure. The patient tolerated the procedure well. She was taken to the recovery room in satisfactory condition. Dictated by:   Bing Neighbors Clearance Coots, M.D. Attending Physician:  Michaelle Copas DD:  12/25/01 TD:  12/26/01 Job: [id number]

## 2011-05-07 ENCOUNTER — Ambulatory Visit (HOSPITAL_COMMUNITY)
Admission: RE | Admit: 2011-05-07 | Discharge: 2011-05-07 | Disposition: A | Discharge status: Home / Self Care | Payer: Self-pay | Source: Ambulatory Visit | Attending: Internal Medicine | Admitting: Internal Medicine

## 2011-05-07 DIAGNOSIS — I059 Rheumatic mitral valve disease, unspecified: Secondary | ICD-10-CM | POA: Insufficient documentation

## 2011-05-07 DIAGNOSIS — I079 Rheumatic tricuspid valve disease, unspecified: Secondary | ICD-10-CM | POA: Insufficient documentation

## 2011-05-07 DIAGNOSIS — I379 Nonrheumatic pulmonary valve disorder, unspecified: Secondary | ICD-10-CM | POA: Insufficient documentation

## 2011-05-07 DIAGNOSIS — R011 Cardiac murmur, unspecified: Secondary | ICD-10-CM

## 2011-05-07 DIAGNOSIS — R079 Chest pain, unspecified: Secondary | ICD-10-CM | POA: Insufficient documentation

## 2011-09-07 LAB — POCT RAPID STREP A: Streptococcus, Group A Screen (Direct): NEGATIVE

## 2011-10-13 ENCOUNTER — Other Ambulatory Visit: Payer: Self-pay | Admitting: Internal Medicine

## 2011-10-13 DIAGNOSIS — M549 Dorsalgia, unspecified: Secondary | ICD-10-CM

## 2011-10-20 ENCOUNTER — Ambulatory Visit (HOSPITAL_COMMUNITY)
Admission: RE | Admit: 2011-10-20 | Discharge: 2011-10-20 | Disposition: A | Discharge status: Home / Self Care | Payer: Self-pay | Source: Ambulatory Visit | Attending: Internal Medicine | Admitting: Internal Medicine

## 2011-10-20 DIAGNOSIS — M545 Low back pain, unspecified: Secondary | ICD-10-CM | POA: Insufficient documentation

## 2011-10-20 DIAGNOSIS — M546 Pain in thoracic spine: Secondary | ICD-10-CM | POA: Insufficient documentation

## 2011-10-20 DIAGNOSIS — M549 Dorsalgia, unspecified: Secondary | ICD-10-CM

## 2011-11-10 ENCOUNTER — Ambulatory Visit: Payer: Self-pay | Admitting: Physical Therapy

## 2011-11-23 ENCOUNTER — Ambulatory Visit: Payer: Self-pay | Attending: Internal Medicine | Admitting: Rehabilitative and Restorative Service Providers"

## 2011-11-23 ENCOUNTER — Ambulatory Visit: Payer: Self-pay | Admitting: Rehabilitative and Restorative Service Providers"

## 2012-09-16 ENCOUNTER — Encounter (HOSPITAL_COMMUNITY): Payer: Self-pay

## 2012-09-16 ENCOUNTER — Emergency Department (INDEPENDENT_AMBULATORY_CARE_PROVIDER_SITE_OTHER)
Admission: EM | Admit: 2012-09-16 | Discharge: 2012-09-16 | Disposition: A | Discharge status: Home / Self Care | Payer: Medicaid Other | Source: Home / Self Care

## 2012-09-16 DIAGNOSIS — J069 Acute upper respiratory infection, unspecified: Secondary | ICD-10-CM

## 2012-09-16 HISTORY — DX: Essential (primary) hypertension: I10

## 2012-09-16 MED ORDER — IPRATROPIUM BROMIDE 0.03 % NA SOLN: 2.0000 | Freq: Two times a day (BID) | NASAL | Status: DC

## 2012-09-16 MED ORDER — LORATADINE 10 MG PO TABS: 10.0000 mg | ORAL_TABLET | Freq: Every day | ORAL | Status: DC

## 2012-09-16 NOTE — ED Notes (Signed)
Discussed method of use for  nasal spray

## 2012-09-16 NOTE — ED Notes (Signed)
C/o facial pain, congestion, stuffiness , dental pressure for past few days; NAD

## 2012-09-16 NOTE — ED Provider Notes (Signed)
History     CSN: [id number]  Arrival date & time 09/16/12  0950   None     Chief Complaint  Patient presents with  . Facial Pain    (Consider location/radiation/quality/duration/timing/severity/associated sxs/prior treatment) HPI Comments: 41 year old female presents with sore throat, runny nose, mild dizziness, for 3 or 4 days. She denies fever cough or shortness of breath. She endorses PND and frequently clearing of the throat   Past Medical History  Diagnosis Date  . Hypertension     History reviewed. No pertinent past surgical history.  History reviewed. No pertinent family history.  History  Substance Use Topics  . Smoking status: Not on file  . Smokeless tobacco: Not on file  . Alcohol Use:     OB History    Grav Para Term Preterm Abortions TAB SAB Ect Mult Living                  Review of Systems  Constitutional: Negative for fever, chills, activity change, appetite change and fatigue.  HENT: Positive for congestion, rhinorrhea and postnasal drip. Negative for facial swelling, neck pain and neck stiffness.   Eyes: Negative.   Respiratory: Negative.   Cardiovascular: Negative.   Gastrointestinal: Negative.   Skin: Negative for pallor and rash.  Neurological: Negative.     Allergies  Lisinopril  Home Medications   Current Outpatient Rx  Name Route Sig Dispense Refill  . ATENOLOL 50 MG PO TABS Oral Take 50 mg by mouth daily.    . IPRATROPIUM BROMIDE 0.03 % NA SOLN Nasal Place 2 sprays into the nose every 12 (twelve) hours. 30 mL 12  . LORATADINE 10 MG PO TABS Oral Take 1 tablet (10 mg total) by mouth daily. 20 tablet 0    BP 172/93  Pulse 73  Temp 97.8 F (36.6 C) (Oral)  Resp 20  SpO2 97%  Physical Exam  Constitutional: She is oriented to person, place, and time. She appears well-developed and well-nourished. No distress.  HENT:  Head: Normocephalic.  Right Ear: External ear normal.  Left Ear: External ear normal.  Mouth/Throat: No  oropharyngeal exudate.       Minor erythema of the posterior oropharynx. More and streaky type fashion. No exudates or edema  Neck: Normal range of motion. Neck supple.  Cardiovascular: Normal rate and regular rhythm.   Murmur heard. Pulmonary/Chest: Effort normal. No respiratory distress. She has no rales.  Musculoskeletal: Normal range of motion.  Lymphadenopathy:    She has no cervical adenopathy.  Neurological: She is alert and oriented to person, place, and time. No cranial nerve deficit.  Skin: Skin is warm and dry.  Psychiatric: She has a normal mood and affect.    ED Course  Procedures (including critical care time)  Labs Reviewed - No data to display No results found.   1. URI (upper respiratory infection)       MDM  Claritin 10 mg one a day when necessary drainage Atrovent 0.03% nasal spray every 12 hours as directed Drink plenty of fluids stay well hydrated Continue to take your        Hayden Rasmussen, NP 09/16/12 1138

## 2012-09-17 NOTE — ED Provider Notes (Signed)
Medical screening examination/treatment/procedure(s) were performed by resident physician or non-physician practitioner and as supervising physician I was immediately available for consultation/collaboration.   KINDL,JAMES DOUGLAS MD.    James D Kindl, MD 09/17/12 1732 

## 2012-11-07 ENCOUNTER — Emergency Department (HOSPITAL_COMMUNITY): Admission: EM | Admit: 2012-11-07 | Discharge: 2012-11-07 | Discharge status: Absent without leave | Payer: Self-pay

## 2012-11-07 ENCOUNTER — Emergency Department (INDEPENDENT_AMBULATORY_CARE_PROVIDER_SITE_OTHER)
Admission: EM | Admit: 2012-11-07 | Discharge: 2012-11-07 | Disposition: A | Discharge status: Home / Self Care | Payer: Medicaid Other | Source: Home / Self Care | Attending: Family Medicine | Admitting: Family Medicine

## 2012-11-07 ENCOUNTER — Encounter (HOSPITAL_COMMUNITY): Payer: Self-pay | Admitting: Emergency Medicine

## 2012-11-07 DIAGNOSIS — I10 Essential (primary) hypertension: Secondary | ICD-10-CM

## 2012-11-07 MED ORDER — AMLODIPINE BESYLATE 10 MG PO TABS: 10.0000 mg | ORAL_TABLET | Freq: Every day | ORAL | Status: DC

## 2012-11-07 MED ORDER — ATENOLOL 50 MG PO TABS: 50.0000 mg | ORAL_TABLET | Freq: Every day | ORAL | Status: DC

## 2012-11-07 NOTE — ED Notes (Signed)
Medication refill

## 2012-11-07 NOTE — ED Provider Notes (Signed)
History     CSN: [id number]  Arrival date & time 11/07/12  2002   First MD Initiated Contact with Patient 11/07/12 2016      Chief Complaint  Patient presents with  . Medication Refill    (Consider location/radiation/quality/duration/timing/severity/associated sxs/prior treatment) Patient is a 41 y.o. female presenting with hypertension. The history is provided by the patient.  Hypertension This is a chronic problem. The current episode started yesterday (here for medicine refills for bp.). The problem has not changed since onset.Pertinent negatives include no chest pain, no headaches and no shortness of breath.    Past Medical History  Diagnosis Date  . Hypertension     History reviewed. No pertinent past surgical history.  History reviewed. No pertinent family history.  History  Substance Use Topics  . Smoking status: Not on file  . Smokeless tobacco: Not on file  . Alcohol Use:     OB History    Grav Para Term Preterm Abortions TAB SAB Ect Mult Living                  Review of Systems  Constitutional: Negative.   Respiratory: Negative.  Negative for shortness of breath.   Cardiovascular: Negative for chest pain, palpitations and leg swelling.  Neurological: Negative for dizziness and headaches.    Allergies  Lisinopril  Home Medications   Current Outpatient Rx  Name  Route  Sig  Dispense  Refill  . AMLODIPINE BESYLATE 10 MG PO TABS   Oral   Take 10 mg by mouth daily.         . ATENOLOL 50 MG PO TABS   Oral   Take 50 mg by mouth daily.         . IPRATROPIUM BROMIDE 0.03 % NA SOLN   Nasal   Place 2 sprays into the nose every 12 (twelve) hours.   30 mL   12   . LORATADINE 10 MG PO TABS   Oral   Take 1 tablet (10 mg total) by mouth daily.   20 tablet   0   . AMLODIPINE BESYLATE 10 MG PO TABS   Oral   Take 1 tablet (10 mg total) by mouth daily.   30 tablet   1   . ATENOLOL 50 MG PO TABS   Oral   Take 1 tablet (50 mg total) by  mouth daily.   30 tablet   1     BP 151/82  Pulse 84  Temp 98.2 F (36.8 C) (Oral)  Resp 16  SpO2 100%  Physical Exam  Nursing note and vitals reviewed. Constitutional: She is oriented to person, place, and time. She appears well-developed and well-nourished.  HENT:  Head: Normocephalic.  Neck: Normal range of motion. Neck supple.  Cardiovascular: Normal rate, regular rhythm, normal heart sounds and intact distal pulses.   Pulmonary/Chest: Effort normal and breath sounds normal.  Lymphadenopathy:    She has no cervical adenopathy.  Neurological: She is alert and oriented to person, place, and time.  Skin: Skin is warm and dry.    ED Course  Procedures (including critical care time)  Labs Reviewed - No data to display No results found.   1. Hypertension, benign essential, goal below 140/90       MDM          Linna Hoff, MD 11/08/12 1743

## 2013-02-17 ENCOUNTER — Encounter (HOSPITAL_COMMUNITY): Payer: Self-pay | Admitting: Emergency Medicine

## 2013-02-17 ENCOUNTER — Emergency Department (HOSPITAL_COMMUNITY)
Admission: EM | Admit: 2013-02-17 | Discharge: 2013-02-17 | Disposition: A | Discharge status: Home / Self Care | Payer: Medicaid Other | Source: Home / Self Care

## 2013-02-17 DIAGNOSIS — I10 Essential (primary) hypertension: Secondary | ICD-10-CM

## 2013-02-17 MED ORDER — AMLODIPINE BESYLATE 5 MG PO TABS: 5.0000 mg | ORAL_TABLET | Freq: Every day | ORAL | Status: DC

## 2013-02-17 NOTE — ED Notes (Addendum)
Pt is c/o increase in blood pressure with headache and sob. Pt denies chest pain.  Pt states that she started checking bp because of headache and it has been running high.  Pt has a hx of hypertension. Pt has taken prescribed dose of blood pressure medication.

## 2013-02-17 NOTE — Discharge Instructions (Signed)
Take Amlodipine 5 mg daily in addition to your Atenolol.     Hypertension Information As your heart beats, it forces blood through your arteries. This force is your blood pressure. If the pressure is too high, it is called hypertension (HTN) or high blood pressure. HTN is dangerous because you may have it and not know it. High blood pressure may mean that your heart has to work harder to pump blood. Your arteries may be narrow or stiff. The extra work puts you at risk for heart disease, stroke, and other problems.  Blood pressure consists of two numbers, a higher number over a lower, 110/72, for example. It is stated as "110 over 72." The ideal is below 120 for the top number (systolic) and under 80 for the bottom (diastolic).  You should pay close attention to your blood pressure if you have certain conditions such as:  Heart failure.   Prior heart attack.   Diabetes   Chronic kidney disease.   Prior stroke.   Multiple risk factors for heart disease.  To see if you have HTN, your blood pressure should be measured while you are seated with your arm held at the level of the heart. It should be measured at least twice. A one-time elevated blood pressure reading (especially in the Emergency Department) does not mean that you need treatment. There may be conditions in which the blood pressure is different between your right and left arms. It is important to see your caregiver soon for a recheck. Most people have essential hypertension which means that there is not a specific cause. This type of high blood pressure may be lowered by changing lifestyle factors such as:  Stress.   Smoking.   Lack of exercise.   Excessive weight.   Drug/tobacco/alcohol use.   Eating less salt.  Most people do not have symptoms from high blood pressure until it has caused damage to the body. Effective treatment can often prevent, delay or reduce that damage. TREATMENT  Treatment for high blood pressure,  when a cause has been identified, is directed at the cause. There are a large number of medications to treat HTN. These fall into several categories, and your caregiver will help you select the medicines that are best for you. Medications may have side effects. You should review side effects with your caregiver. If your blood pressure stays high after you have made lifestyle changes or started on medicines,   Your medication(s) may need to be changed.   Other problems may need to be addressed.   Be certain you understand your prescriptions, and know how and when to take your medicine.   Be sure to follow up with your caregiver within the time frame advised (usually within two weeks) to have your blood pressure rechecked and to review your medications.   If you are taking more than one medicine to lower your blood pressure, make sure you know how and at what times they should be taken. Taking two medicines at the same time can result in blood pressure that is too low.  Document Released: 01/26/2006 Document Revised: 08/05/2011 Document Reviewed: 02/02/2008 [facility] Patient Information 2012 [place], [place].  Call Hospital Referral line for primary care providers:  [phone]

## 2013-02-17 NOTE — ED Provider Notes (Signed)
History     CSN: [id number]  Arrival date & time 02/17/13  1450   First MD Initiated Contact with Patient 02/17/13 1559      No chief complaint on file.   (Consider location/radiation/quality/duration/timing/severity/associated sxs/prior treatment) HPI 42 y.o. female with high blood pressure. Checked at home and bottom number over 100 since yesterday. Headache starting yesterday. Denies chest pain, palpitations, new vision changes, neck pain. Has mild shortness of breath occasionally. Takes atenolol 50 mg a day. Has taken other BP meds in past but not on anything now. (HCTZ made her potassium low and lisinopril gave her a cough.)  Has heart murmur since birth. States she had an echocardiogram a few months ago that was normal. She saw her PCP 3 months ago.   Past Medical History  Diagnosis Date  . Hypertension     History reviewed. No pertinent past surgical history. C-section  History reviewed. No pertinent family history. -   History  Substance Use Topics  . Smoking status: Not on file  . Smokeless tobacco: Not on file  . Alcohol Use: No    OB History   Grav Para Term Preterm Abortions TAB SAB Ect Mult Living                  Review of Systems  Constitutional: Negative for fever and chills.  HENT: Negative for congestion, rhinorrhea, neck pain, neck stiffness and sinus pressure.   Eyes:       Blurring - states needs new glasses, ongoing problem  Respiratory: Positive for shortness of breath. Negative for cough, chest tightness and wheezing.   Cardiovascular: Negative for chest pain, palpitations and leg swelling.  Gastrointestinal: Negative for nausea, vomiting and diarrhea.  Genitourinary: Negative for dysuria, decreased urine volume and difficulty urinating.  Neurological: Positive for headaches. Negative for dizziness, syncope, speech difficulty, weakness, light-headedness and numbness.    Allergies  Lisinopril  Home Medications   No current  facility-administered medications on file prior to encounter.   Current Outpatient Prescriptions on File Prior to Encounter  Medication Sig Dispense Refill  . atenolol (TENORMIN) 50 MG tablet Take 1 tablet (50 mg total) by mouth daily.  30 tablet  1  . atenolol (TENORMIN) 50 MG tablet Take 50 mg by mouth daily.      Marland Kitchen ipratropium (ATROVENT) 0.03 % nasal spray Place 2 sprays into the nose every 12 (twelve) hours.  30 mL  12  . loratadine (CLARITIN) 10 MG tablet Take 1 tablet (10 mg total) by mouth daily.  20 tablet  0  ASA 81 mg   BP 199/97  Pulse 65  Temp(Src) 97.9 F (36.6 C) (Oral)  Resp 16  SpO2 98%  Recheck BP manually:    Left arm:  132/106 Right arm:  138/100  Physical Exam  Constitutional: She is oriented to person, place, and time. She appears well-developed and well-nourished. No distress.  HENT:  Head: Normocephalic and atraumatic.  Eyes: Conjunctivae and EOM are normal. Pupils are equal, round, and reactive to light.  Neck: Normal range of motion.  Cardiovascular: Normal rate and regular rhythm.   Murmur (systolic) heard. Pulmonary/Chest: Effort normal and breath sounds normal. No respiratory distress. She has no wheezes. She has no rales.  Abdominal: Soft. There is no tenderness.  Musculoskeletal: Normal range of motion. She exhibits edema (mild lower extremity). She exhibits no tenderness.  Neurological: She is alert and oriented to person, place, and time.  No focal deficits  Skin: Skin is warm  and dry.  Psychiatric: She has a normal mood and affect.    ED Course  Procedures (including critical care time)  Labs Reviewed - No data to display No results found.   1. Hypertension   Continue atenolol. Start amlodipine 5 mg daily and check BP at home in PM. Follow up with PCP.   Napoleon Form, MD 02/17/13 2222

## 2013-04-05 ENCOUNTER — Emergency Department (INDEPENDENT_AMBULATORY_CARE_PROVIDER_SITE_OTHER)
Admission: EM | Admit: 2013-04-05 | Discharge: 2013-04-05 | Disposition: A | Discharge status: Home / Self Care | Payer: Medicaid Other | Source: Home / Self Care | Attending: Family Medicine | Admitting: Family Medicine

## 2013-04-05 ENCOUNTER — Encounter (HOSPITAL_COMMUNITY): Payer: Self-pay

## 2013-04-05 DIAGNOSIS — S0083XA Contusion of other part of head, initial encounter: Secondary | ICD-10-CM

## 2013-04-05 DIAGNOSIS — S1093XA Contusion of unspecified part of neck, initial encounter: Secondary | ICD-10-CM

## 2013-04-05 DIAGNOSIS — S0003XA Contusion of scalp, initial encounter: Secondary | ICD-10-CM

## 2013-04-05 NOTE — ED Notes (Signed)
States she was concentrating on too many things at once, dropped book she had in her hand , and accidentally struck her face on furnishings

## 2013-04-05 NOTE — ED Provider Notes (Signed)
History     CSN: [id number]  Arrival date & time 04/05/13  1109   First MD Initiated Contact with Patient 04/05/13 1300      Chief Complaint  Patient presents with  . Facial Pain    (Consider location/radiation/quality/duration/timing/severity/associated sxs/prior treatment) Patient is a 42 y.o. female presenting with facial injury. The history is provided by the patient.  Facial Injury  The incident occurred yesterday. The incident occurred at home. Injury mechanism: bumped face on couch arm. Context: pt was reading and dropped a book, bent to get it and accidentally hit face on couch arm. The pain is moderate. Pertinent negatives include no headaches, no neck pain, no light-headedness and no loss of consciousness.    Past Medical History  Diagnosis Date  . Hypertension     History reviewed. No pertinent past surgical history.  History reviewed. No pertinent family history.  History  Substance Use Topics  . Smoking status: Not on file  . Smokeless tobacco: Not on file  . Alcohol Use: No    OB History   Grav Para Term Preterm Abortions TAB SAB Ect Mult Living                  Review of Systems  HENT: Positive for facial swelling. Negative for nosebleeds, neck pain and dental problem.        No trouble breathing through nose, no pain in teeth or dental problem  Skin: Negative for wound.  Neurological: Negative for loss of consciousness, light-headedness and headaches.    Allergies  Lisinopril  Home Medications   Current Outpatient Rx  Name  Route  Sig  Dispense  Refill  . amLODipine (NORVASC) 5 MG tablet   Oral   Take 1 tablet (5 mg total) by mouth daily.   30 tablet   0   . aspirin 81 MG tablet   Oral   Take 81 mg by mouth daily.         Marland Kitchen atenolol (TENORMIN) 50 MG tablet   Oral   Take 50 mg by mouth daily.         Marland Kitchen atenolol (TENORMIN) 50 MG tablet   Oral   Take 1 tablet (50 mg total) by mouth daily.   30 tablet   1   . ipratropium  (ATROVENT) 0.03 % nasal spray   Nasal   Place 2 sprays into the nose every 12 (twelve) hours.   30 mL   12   . loratadine (CLARITIN) 10 MG tablet   Oral   Take 1 tablet (10 mg total) by mouth daily.   20 tablet   0     BP 122/81  Pulse 67  Temp(Src) 98.1 F (36.7 C) (Oral)  Resp 16  SpO2 98%  Physical Exam  Constitutional: She appears well-developed and well-nourished. No distress.  HENT:  Head: Normocephalic. Head is with contusion.    Skin: Skin is warm, dry and intact.  See head exam    ED Course  Procedures (including critical care time)  Labs Reviewed - No data to display No results found.   1. Facial contusion, initial encounter       MDM  Given mechanism of injury (hitting face on padded couch arm when bending over), and exam, unlikely has fx.  Pt to monitor for intensity of pain and swelling and if no improvement or if worsening, pt to go to ER for eval for need of CT.  Cathlyn Parsons, NP 04/05/13 [phone]

## 2013-04-06 NOTE — ED Provider Notes (Signed)
Medical screening examination/treatment/procedure(s) were performed by non-physician practitioner and as supervising physician I was immediately available for consultation/collaboration.   [facility]; MD  Sharin Grave, MD 04/06/13 [phone]

## 2014-09-01 ENCOUNTER — Encounter (HOSPITAL_COMMUNITY): Payer: Self-pay | Admitting: Emergency Medicine

## 2014-09-01 ENCOUNTER — Emergency Department (INDEPENDENT_AMBULATORY_CARE_PROVIDER_SITE_OTHER)
Admission: EM | Admit: 2014-09-01 | Discharge: 2014-09-01 | Disposition: A | Discharge status: Home / Self Care | Payer: Self-pay | Source: Home / Self Care | Attending: Family Medicine | Admitting: Family Medicine

## 2014-09-01 DIAGNOSIS — R519 Headache, unspecified: Secondary | ICD-10-CM

## 2014-09-01 DIAGNOSIS — M542 Cervicalgia: Secondary | ICD-10-CM

## 2014-09-01 DIAGNOSIS — R51 Headache: Secondary | ICD-10-CM

## 2014-09-01 MED ORDER — AMLODIPINE BESYLATE 5 MG PO TABS: 5.0000 mg | ORAL_TABLET | Freq: Every day | ORAL | Status: DC

## 2014-09-01 MED ORDER — ACETAMINOPHEN 325 MG PO TABS
ORAL_TABLET | ORAL | Status: AC
  Filled 2014-09-01: qty 2

## 2014-09-01 MED ORDER — ACETAMINOPHEN 325 MG PO TABS
650.0000 mg | ORAL_TABLET | Freq: Once | ORAL | Status: AC
  Administered 2014-09-01: 650 mg via ORAL

## 2014-09-01 NOTE — ED Notes (Signed)
C/o  Neck pain for several days but has gradually gotten worse. Denies injury.  Mild relief with otc pain meds.    Hx of hypertension.  C/o headaches.  Denies visual changes.

## 2014-09-01 NOTE — ED Notes (Signed)
Pt states that she was also prescribed norvasc but has not been taking it.  Mw,cma

## 2014-09-01 NOTE — Discharge Instructions (Signed)
Please restart your Norvasc as prescribed, stay well hydrated, and use tylenol or ibuprofen as directed packaging for discomfort. If symptoms become suddenly worse or severe, report to your nearest emergency room for assistance. If symptoms persist, please contact your primary care doctor for follow up evaluation.   General Headache Without Cause A headache is pain or discomfort felt around the head or neck area. The specific cause of a headache may not be found. There are many causes and types of headaches. A few common ones are:  Tension headaches.  Migraine headaches.  Cluster headaches.  Chronic daily headaches. HOME CARE INSTRUCTIONS   Keep all follow-up appointments with your caregiver or any specialist referral.  Only take over-the-counter or prescription medicines for pain or discomfort as directed by your caregiver.  Lie down in a dark, quiet room when you have a headache.  Keep a headache journal to find out what may trigger your migraine headaches. For example, write down:  What you eat and drink.  How much sleep you get.  Any change to your diet or medicines.  Try massage or other relaxation techniques.  Put ice packs or heat on the head and neck. Use these 3 to 4 times per day for 15 to 20 minutes each time, or as needed.  Limit stress.  Sit up straight, and do not tense your muscles.  Quit smoking if you smoke.  Limit alcohol use.  Decrease the amount of caffeine you drink, or stop drinking caffeine.  Eat and sleep on a regular schedule.  Get 7 to 9 hours of sleep, or as recommended by your caregiver.  Keep lights dim if bright lights bother you and make your headaches worse. SEEK MEDICAL CARE IF:   You have problems with the medicines you were prescribed.  Your medicines are not working.  You have a change from the usual headache.  You have nausea or vomiting. SEEK IMMEDIATE MEDICAL CARE IF:   Your headache becomes severe.  You have a  fever.  You have a stiff neck.  You have loss of vision.  You have muscular weakness or loss of muscle control.  You start losing your balance or have trouble walking.  You feel faint or pass out.  You have severe symptoms that are different from your first symptoms. MAKE SURE YOU:   Understand these instructions.  Will watch your condition.  Will get help right away if you are not doing well or get worse. Document Released: 11/23/2005 Document Revised: 02/15/2012 Document Reviewed: 12/09/2011 [facility] Patient Information 2015 [place], [place]. This information is not intended to replace advice given to you by your health care provider. Make sure you discuss any questions you have with your health care provider.  Headaches, Frequently Asked Questions MIGRAINE HEADACHES Q: What is migraine? What causes it? How can I treat it? A: Generally, migraine headaches begin as a dull ache. Then they develop into a constant, throbbing, and pulsating pain. You may experience pain at the temples. You may experience pain at the front or back of one or both sides of the head. The pain is usually accompanied by a combination of:  Nausea.  Vomiting.  Sensitivity to light and noise. Some people (about 15%) experience an aura (see below) before an attack. The cause of migraine is believed to be chemical reactions in the brain. Treatment for migraine may include over-the-counter or prescription medications. It may also include self-help techniques. These include relaxation training and biofeedback.  Q: What is an  aura? A: About 15% of people with migraine get an "aura". This is a sign of neurological symptoms that occur before a migraine headache. You may see wavy or jagged lines, dots, or flashing lights. You might experience tunnel vision or blind spots in one or both eyes. The aura can include visual or auditory hallucinations (something imagined). It may include disruptions in smell (such as strange  odors), taste or touch. Other symptoms include:  Numbness.  A "pins and needles" sensation.  Difficulty in recalling or speaking the correct word. These neurological events may last as long as 60 minutes. These symptoms will fade as the headache begins. Q: What is a trigger? A: Certain physical or environmental factors can lead to or "trigger" a migraine. These include:  Foods.  Hormonal changes.  Weather.  Stress. It is important to remember that triggers are different for everyone. To help prevent migraine attacks, you need to figure out which triggers affect you. Keep a headache diary. This is a good way to track triggers. The diary will help you talk to your healthcare professional about your condition. Q: Does weather affect migraines? A: Bright sunshine, hot, humid conditions, and drastic changes in barometric pressure may lead to, or "trigger," a migraine attack in some people. But studies have shown that weather does not act as a trigger for everyone with migraines. Q: What is the link between migraine and hormones? A: Hormones start and regulate many of your body's functions. Hormones keep your body in balance within a constantly changing environment. The levels of hormones in your body are unbalanced at times. Examples are during menstruation, pregnancy, or menopause. That can lead to a migraine attack. In fact, about three quarters of all women with migraine report that their attacks are related to the menstrual cycle.  Q: Is there an increased risk of stroke for migraine sufferers? A: The likelihood of a migraine attack causing a stroke is very remote. That is not to say that migraine sufferers cannot have a stroke associated with their migraines. In persons under age 63, the most common associated factor for stroke is migraine headache. But over the course of a person's normal life span, the occurrence of migraine headache may actually be associated with a reduced risk of dying  from cerebrovascular disease due to stroke.  Q: What are acute medications for migraine? A: Acute medications are used to treat the pain of the headache after it has started. Examples over-the-counter medications, NSAIDs, ergots, and triptans.  Q: What are the triptans? A: Triptans are the newest class of abortive medications. They are specifically targeted to treat migraine. Triptans are vasoconstrictors. They moderate some chemical reactions in the brain. The triptans work on receptors in your brain. Triptans help to restore the balance of a neurotransmitter called serotonin. Fluctuations in levels of serotonin are thought to be a main cause of migraine.  Q: Are over-the-counter medications for migraine effective? A: Over-the-counter, or "OTC," medications may be effective in relieving mild to moderate pain and associated symptoms of migraine. But you should see your caregiver before beginning any treatment regimen for migraine.  Q: What are preventive medications for migraine? A: Preventive medications for migraine are sometimes referred to as "prophylactic" treatments. They are used to reduce the frequency, severity, and length of migraine attacks. Examples of preventive medications include antiepileptic medications, antidepressants, beta-blockers, calcium channel blockers, and NSAIDs (nonsteroidal anti-inflammatory drugs). Q: Why are anticonvulsants used to treat migraine? A: During the past few years, there has been  an increased interest in antiepileptic drugs for the prevention of migraine. They are sometimes referred to as "anticonvulsants". Both epilepsy and migraine may be caused by similar reactions in the brain.  Q: Why are antidepressants used to treat migraine? A: Antidepressants are typically used to treat people with depression. They may reduce migraine frequency by regulating chemical levels, such as serotonin, in the brain.  Q: What alternative therapies are used to treat migraine? A:  The term "alternative therapies" is often used to describe treatments considered outside the scope of conventional Western medicine. Examples of alternative therapy include acupuncture, acupressure, and yoga. Another common alternative treatment is herbal therapy. Some herbs are believed to relieve headache pain. Always discuss alternative therapies with your caregiver before proceeding. Some herbal products contain arsenic and other toxins. TENSION HEADACHES Q: What is a tension-type headache? What causes it? How can I treat it? A: Tension-type headaches occur randomly. They are often the result of temporary stress, anxiety, fatigue, or anger. Symptoms include soreness in your temples, a tightening band-like sensation around your head (a "vice-like" ache). Symptoms can also include a pulling feeling, pressure sensations, and contracting head and neck muscles. The headache begins in your forehead, temples, or the back of your head and neck. Treatment for tension-type headache may include over-the-counter or prescription medications. Treatment may also include self-help techniques such as relaxation training and biofeedback. CLUSTER HEADACHES Q: What is a cluster headache? What causes it? How can I treat it? A: Cluster headache gets its name because the attacks come in groups. The pain arrives with little, if any, warning. It is usually on one side of the head. A tearing or bloodshot eye and a runny nose on the same side of the headache may also accompany the pain. Cluster headaches are believed to be caused by chemical reactions in the brain. They have been described as the most severe and intense of any headache type. Treatment for cluster headache includes prescription medication and oxygen. SINUS HEADACHES Q: What is a sinus headache? What causes it? How can I treat it? A: When a cavity in the bones of the face and skull (a sinus) becomes inflamed, the inflammation will cause localized pain. This  condition is usually the result of an allergic reaction, a tumor, or an infection. If your headache is caused by a sinus blockage, such as an infection, you will probably have a fever. An x-ray will confirm a sinus blockage. Your caregiver's treatment might include antibiotics for the infection, as well as antihistamines or decongestants.  REBOUND HEADACHES Q: What is a rebound headache? What causes it? How can I treat it? A: A pattern of taking acute headache medications too often can lead to a condition known as "rebound headache." A pattern of taking too much headache medication includes taking it more than 2 days per week or in excessive amounts. That means more than the label or a caregiver advises. With rebound headaches, your medications not only stop relieving pain, they actually begin to cause headaches. Doctors treat rebound headache by tapering the medication that is being overused. Sometimes your caregiver will gradually substitute a different type of treatment or medication. Stopping may be a challenge. Regularly overusing a medication increases the potential for serious side effects. Consult a caregiver if you regularly use headache medications more than 2 days per week or more than the label advises. ADDITIONAL QUESTIONS AND ANSWERS Q: What is biofeedback? A: Biofeedback is a self-help treatment. Biofeedback uses special equipment to monitor your  body's involuntary physical responses. Biofeedback monitors:  Breathing.  Pulse.  Heart rate.  Temperature.  Muscle tension.  Brain activity. Biofeedback helps you refine and perfect your relaxation exercises. You learn to control the physical responses that are related to stress. Once the technique has been mastered, you do not need the equipment any more. Q: Are headaches hereditary? A: Four out of five (80%) of people that suffer report a family history of migraine. Scientists are not sure if this is genetic or a family predisposition.  Despite the uncertainty, a child has a 50% chance of having migraine if one parent suffers. The child has a 75% chance if both parents suffer.  Q: Can children get headaches? A: By the time they reach high school, most young people have experienced some type of headache. Many safe and effective approaches or medications can prevent a headache from occurring or stop it after it has begun.  Q: What type of doctor should I see to diagnose and treat my headache? A: Start with your primary caregiver. Discuss his or her experience and approach to headaches. Discuss methods of classification, diagnosis, and treatment. Your caregiver may decide to recommend you to a headache specialist, depending upon your symptoms or other physical conditions. Having diabetes, allergies, etc., may require a more comprehensive and inclusive approach to your headache. The National Head[employer] Foundation will provide, upon request, a list of [facility] physician members in your state. Document Released: 02/13/2004 Document Revised: 02/15/2012 Document Reviewed: 07/23/2008 [facility] Patient Information 2015 [place], [place]. This information is not intended to replace advice given to you by your health care provider. Make sure you discuss any questions you have with your health care provider.

## 2014-09-01 NOTE — ED Provider Notes (Signed)
Medical screening examination/treatment/procedure(s) were performed by resident physician or non-physician practitioner and as supervising physician I was immediately available for consultation/collaboration.   Rhylee Pucillo DOUGLAS MD.   Elsbeth Yearick D Salif Tay, MD 09/01/14 1428 

## 2014-09-01 NOTE — ED Provider Notes (Signed)
CSN: [id number]     Arrival date & time 09/01/14  1156 History   First MD Initiated Contact with Patient 09/01/14 1223     Chief Complaint  Patient presents with  . Neck Pain   (Consider location/radiation/quality/duration/timing/severity/associated sxs/prior Treatment) HPI Comments: Patient reports a few days of waxing/waning posterior neck discomfort without reported injury, fever or additional illness. Reports some relief with OTC pain relievers. Denies rash, or changes in strength or sensation of upper extremities. Next reports headache that occurs on the top of her head. Again denies recent head injury. She states she ran out of her Norvasc about one month ago and has noticed that her blood pressure has been elevated and questions if elevated BP is cause for headaches. Denies changes in speech, vision, sensation, strength or balance.  PCP: [facility] on [address]. In [place]. States HTN managed by this clinic, but she has recently traveled [place] and ran out of her medication while traveling.  The history is provided by the patient.    Past Medical History  Diagnosis Date  . Hypertension    History reviewed. No pertinent past surgical history. History reviewed. No pertinent family history. History  Substance Use Topics  . Smoking status: Never Smoker   . Smokeless tobacco: Not on file  . Alcohol Use: No   OB History   Grav Para Term Preterm Abortions TAB SAB Ect Mult Living                 Review of Systems  All other systems reviewed and are negative.   Allergies  Lisinopril  Home Medications   Prior to Admission medications   Medication Sig Start Date End Date Taking? Authorizing Provider  atenolol (TENORMIN) 50 MG tablet Take 50 mg by mouth daily.   Yes Historical Provider, MD  atenolol (TENORMIN) 50 MG tablet Take 1 tablet (50 mg total) by mouth daily. 11/07/12  Yes Linna Hoff, MD  amLODipine (NORVASC) 5 MG tablet Take 1 tablet (5 mg total) by mouth  daily. 02/17/13   Napoleon Form, MD  amLODipine (NORVASC) 5 MG tablet Take 1 tablet (5 mg total) by mouth daily. 09/01/14   Mathis Fare Asya Derryberry, PA  aspirin 81 MG tablet Take 81 mg by mouth daily.    Historical Provider, MD  ipratropium (ATROVENT) 0.03 % nasal spray Place 2 sprays into the nose every 12 (twelve) hours. 09/16/12   Hayden Rasmussen, NP  loratadine (CLARITIN) 10 MG tablet Take 1 tablet (10 mg total) by mouth daily. 09/16/12   Hayden Rasmussen, NP   BP 162/96  Pulse 64  Temp(Src) 97.2 F (36.2 C) (Oral)  Resp 16  SpO2 97% Physical Exam  Nursing note and vitals reviewed. Constitutional: She is oriented to person, place, and time. She appears well-developed and well-nourished. No distress.  HENT:  Head: Normocephalic and atraumatic.  Right Ear: Hearing, tympanic membrane, external ear and ear canal normal.  Left Ear: Hearing, tympanic membrane, external ear and ear canal normal.  Nose: Nose normal.  Mouth/Throat: Uvula is midline, oropharynx is clear and moist and mucous membranes are normal. No oral lesions. No trismus in the jaw. No uvula swelling.  Eyes: Conjunctivae are normal. No scleral icterus.  Neck: Trachea normal, normal range of motion, full passive range of motion without pain and phonation normal. Neck supple. No spinous process tenderness and no muscular tenderness present. No mass and no thyromegaly present.    Outlined area is region of generalized discomfort  Cardiovascular: Normal rate, regular rhythm and normal heart sounds.   Pulmonary/Chest: Effort normal and breath sounds normal. She has no wheezes.  Musculoskeletal: Normal range of motion.  Neuromuscular and neurovascular exam of bilateral upper extremities is normal.   Lymphadenopathy:    She has no cervical adenopathy.  Neurological: She is alert and oriented to person, place, and time. She has normal strength. No cranial nerve deficit or sensory deficit. Coordination and gait normal. GCS eye subscore is 4. GCS  verbal subscore is 5. GCS motor subscore is 6.  Skin: Skin is warm and dry. No rash noted. No erythema.  Psychiatric: She has a normal mood and affect. Her behavior is normal.    ED Course  Procedures (including critical care time) Labs Review Labs Reviewed - No data to display  Imaging Review No results found.   MDM   1. Nonintractable headache, unspecified chronicity pattern, unspecified headache type   2. Neck discomfort    Will restart patient on her Norvasc 5 mg QD. While examination does not suggest acute intracranial process, meningitis or cervical radiculopathy, etiology of headache and neck discomfort unclear. Will advice using tylenol and/or ibuprofen as directed on packaging for discomfort, staying well hydrated and taking medications as prescribed along with PCP follow up if no improvement.    Ria Clock, [place] 09/01/14 [phone]

## 2015-07-05 ENCOUNTER — Ambulatory Visit (INDEPENDENT_AMBULATORY_CARE_PROVIDER_SITE_OTHER): Payer: Medicaid Other | Admitting: Internal Medicine

## 2015-07-05 ENCOUNTER — Encounter: Payer: Self-pay | Admitting: Internal Medicine

## 2015-07-05 VITALS — BP 152/78 | HR 71 | Temp 98.5°F | Ht 61.0 in | Wt 150.0 lb

## 2015-07-05 DIAGNOSIS — E785 Hyperlipidemia, unspecified: Secondary | ICD-10-CM

## 2015-07-05 DIAGNOSIS — R739 Hyperglycemia, unspecified: Secondary | ICD-10-CM | POA: Diagnosis not present

## 2015-07-05 DIAGNOSIS — I10 Essential (primary) hypertension: Secondary | ICD-10-CM

## 2015-07-05 DIAGNOSIS — M542 Cervicalgia: Secondary | ICD-10-CM | POA: Diagnosis not present

## 2015-07-05 LAB — LIPID PANEL
Cholesterol: 152 mg/dL (ref 125–200)
HDL: 39 mg/dL — ABNORMAL LOW (ref >=46)
LDL Cholesterol: 84 mg/dL (ref <130)
Total CHOL/HDL Ratio: 3.9 Ratio (ref <=5.0)
Triglycerides: 143 mg/dL (ref <150)
VLDL: 29 mg/dL (ref <30)

## 2015-07-05 LAB — COMPREHENSIVE METABOLIC PANEL
ALT: 13 U/L (ref 6–29)
AST: 15 U/L (ref 10–30)
Albumin: 3.9 g/dL (ref 3.6–5.1)
Alkaline Phosphatase: 50 U/L (ref 33–115)
BUN: 13 mg/dL (ref 7–25)
CO2: 31 mmol/L (ref 20–31)
Calcium: 9.8 mg/dL (ref 8.6–10.2)
Chloride: 100 mmol/L (ref 98–110)
Creat: 0.61 mg/dL (ref 0.50–1.10)
Glucose, Bld: 109 mg/dL — ABNORMAL HIGH (ref 65–99)
Potassium: 3.8 mmol/L (ref 3.5–5.3)
Sodium: 140 mmol/L (ref 135–146)
Total Bilirubin: 0.3 mg/dL (ref 0.2–1.2)
Total Protein: 7.1 g/dL (ref 6.1–8.1)

## 2015-07-05 LAB — POCT GLYCOSYLATED HEMOGLOBIN (HGB A1C): Hemoglobin A1C: 5.5

## 2015-07-05 MED ORDER — ATENOLOL-CHLORTHALIDONE 50-25 MG PO TABS: 1.0000 | ORAL_TABLET | Freq: Every day | ORAL | Status: DC

## 2015-07-05 NOTE — Addendum Note (Signed)
Addended by: Jennette Bill on: 07/05/2015 11:58 AM   Modules accepted: Orders

## 2015-07-05 NOTE — Patient Instructions (Addendum)
It was nice to meet you! Thank you for coming into clinic today.  I have refilled your blood pressure medication (Atenolol-Chlorthalidone). You can pick this up at your pharmacy. Please check your blood pressures at home and write them down for our next visit.  I have ordered some yearly labs for you. Please have these done today.  For your neck pain, I would recommend some heat and neck exercises. Please see the instructions below.  I would like to see you back in 1 month for follow-up of your blood pressure and to have your Pap smear done.  Let us know if you have any questions or concerns.  -Dr. Nancy Marus   Cervical Strain and Sprain (Whiplash) with Rehab Cervical strain and sprain are injuries that commonly occur with "whiplash" injuries. Whiplash occurs when the neck is forcefully whipped backward or forward, such as during a motor vehicle accident or during contact sports. The muscles, ligaments, tendons, discs, and nerves of the neck are susceptible to injury when this occurs. RISK FACTORS Risk of having a whiplash injury increases if:  Osteoarthritis of the spine.  Situations that make head or neck accidents or trauma more likely.  High-risk sports (football, rugby, wrestling, hockey, auto racing, gymnastics, diving, contact karate, or boxing).  Poor strength and flexibility of the neck.  Previous neck injury.  Poor tackling technique.  Improperly fitted or padded equipment. SYMPTOMS   Pain or stiffness in the front or back of neck or both.  Symptoms may present immediately or up to 24 hours after injury.  Dizziness, headache, nausea, and vomiting.  Muscle spasm with soreness and stiffness in the neck.  Tenderness and swelling at the injury site. PREVENTION  Learn and use proper technique (avoid tackling with the head, spearing, and head-butting; use proper falling techniques to avoid landing on the head).  Warm up and stretch properly before  activity.  Maintain physical fitness:  Strength, flexibility, and endurance.  Cardiovascular fitness.  Wear properly fitted and padded protective equipment, such as padded soft collars, for participation in contact sports. PROGNOSIS  Recovery from cervical strain and sprain injuries is dependent on the extent of the injury. These injuries are usually curable in 1 week to 3 months with appropriate treatment.  RELATED COMPLICATIONS   Temporary numbness and weakness may occur if the nerve roots are damaged, and this may persist until the nerve has completely healed.  Chronic pain due to frequent recurrence of symptoms.  Prolonged healing, especially if activity is resumed too soon (before complete recovery). TREATMENT  Treatment initially involves the use of ice and medication to help reduce pain and inflammation. It is also important to perform strengthening and stretching exercises and modify activities that worsen symptoms so the injury does not get worse. These exercises may be performed at home or with a therapist. For patients who experience severe symptoms, a soft, padded collar may be recommended to be worn around the neck.  Improving your posture may help reduce symptoms. Posture improvement includes pulling your chin and abdomen in while sitting or standing. If you are sitting, sit in a firm chair with your buttocks against the back of the chair. While sleeping, try replacing your pillow with a small towel rolled to 2 inches in diameter, or use a cervical pillow or soft cervical collar. Poor sleeping positions delay healing.  For patients with nerve root damage, which causes numbness or weakness, the use of a cervical traction apparatus may be recommended. Surgery is rarely necessary for  these injuries. However, cervical strain and sprains that are present at birth (congenital) may require surgery. MEDICATION   If pain medication is necessary, nonsteroidal anti-inflammatory  medications, such as aspirin and ibuprofen, or other minor pain relievers, such as acetaminophen, are often recommended.  Do not take pain medication for 7 days before surgery.  Prescription pain relievers may be given if deemed necessary by your caregiver. Use only as directed and only as much as you need. HEAT AND COLD:   Cold treatment (icing) relieves pain and reduces inflammation. Cold treatment should be applied for 10 to 15 minutes every 2 to 3 hours for inflammation and pain and immediately after any activity that aggravates your symptoms. Use ice packs or an ice massage.  Heat treatment may be used prior to performing the stretching and strengthening activities prescribed by your caregiver, physical therapist, or athletic trainer. Use a heat pack or a warm soak. SEEK MEDICAL CARE IF:   Symptoms get worse or do not improve in 2 weeks despite treatment.  New, unexplained symptoms develop (drugs used in treatment may produce side effects). EXERCISES RANGE OF MOTION (ROM) AND STRETCHING EXERCISES - Cervical Strain and Sprain These exercises may help you when beginning to rehabilitate your injury. In order to successfully resolve your symptoms, you must improve your posture. These exercises are designed to help reduce the forward-head and rounded-shoulder posture which contributes to this condition. Your symptoms may resolve with or without further involvement from your physician, physical therapist or athletic trainer. While completing these exercises, remember:   Restoring tissue flexibility helps normal motion to return to the joints. This allows healthier, less painful movement and activity.  An effective stretch should be held for at least 20 seconds, although you may need to begin with shorter hold times for comfort.  A stretch should never be painful. You should only feel a gentle lengthening or release in the stretched tissue. STRETCH- Axial Extensors  Lie on your back on the  floor. You may bend your knees for comfort. Place a rolled-up hand towel or dish towel, about 2 inches in diameter, under the part of your head that makes contact with the floor.  Gently tuck your chin, as if trying to make a "double chin," until you feel a gentle stretch at the base of your head.  Hold __________ seconds. Repeat __________ times. Complete this exercise __________ times per day.  STRETCH - Axial Extension   Stand or sit on a firm surface. Assume a good posture: chest up, shoulders drawn back, abdominal muscles slightly tense, knees unlocked (if standing) and feet hip width apart.  Slowly retract your chin so your head slides back and your chin slightly lowers. Continue to look straight ahead.  You should feel a gentle stretch in the back of your head. Be certain not to feel an aggressive stretch since this can cause headaches later.  Hold for __________ seconds. Repeat __________ times. Complete this exercise __________ times per day. STRETCH - Cervical Side Bend   Stand or sit on a firm surface. Assume a good posture: chest up, shoulders drawn back, abdominal muscles slightly tense, knees unlocked (if standing) and feet hip width apart.  Without letting your nose or shoulders move, slowly tip your right / left ear to your shoulder until your feel a gentle stretch in the muscles on the opposite side of your neck.  Hold __________ seconds. Repeat __________ times. Complete this exercise __________ times per day. STRETCH - Cervical Rotators  Stand or sit on a firm surface. Assume a good posture: chest up, shoulders drawn back, abdominal muscles slightly tense, knees unlocked (if standing) and feet hip width apart.  Keeping your eyes level with the ground, slowly turn your head until you feel a gentle stretch along the back and opposite side of your neck.  Hold __________ seconds. Repeat __________ times. Complete this exercise __________ times per day. RANGE OF MOTION  - Neck Circles   Stand or sit on a firm surface. Assume a good posture: chest up, shoulders drawn back, abdominal muscles slightly tense, knees unlocked (if standing) and feet hip width apart.  Gently roll your head down and around from the back of one shoulder to the back of the other. The motion should never be forced or painful.  Repeat the motion 10-20 times, or until you feel the neck muscles relax and loosen. Repeat __________ times. Complete the exercise __________ times per day. STRENGTHENING EXERCISES - Cervical Strain and Sprain These exercises may help you when beginning to rehabilitate your injury. They may resolve your symptoms with or without further involvement from your physician, physical therapist, or athletic trainer. While completing these exercises, remember:   Muscles can gain both the endurance and the strength needed for everyday activities through controlled exercises.  Complete these exercises as instructed by your physician, physical therapist, or athletic trainer. Progress the resistance and repetitions only as guided.  You may experience muscle soreness or fatigue, but the pain or discomfort you are trying to eliminate should never worsen during these exercises. If this pain does worsen, stop and make certain you are following the directions exactly. If the pain is still present after adjustments, discontinue the exercise until you can discuss the trouble with your clinician. STRENGTH - Cervical Flexors, Isometric  Face a wall, standing about 6 inches away. Place a small pillow, a ball about 6-8 inches in diameter, or a folded towel between your forehead and the wall.  Slightly tuck your chin and gently push your forehead into the soft object. Push only with mild to moderate intensity, building up tension gradually. Keep your jaw and forehead relaxed.  Hold 10 to 20 seconds. Keep your breathing relaxed.  Release the tension slowly. Relax your neck muscles  completely before you start the next repetition. Repeat __________ times. Complete this exercise __________ times per day. STRENGTH- Cervical Lateral Flexors, Isometric   Stand about 6 inches away from a wall. Place a small pillow, a ball about 6-8 inches in diameter, or a folded towel between the side of your head and the wall.  Slightly tuck your chin and gently tilt your head into the soft object. Push only with mild to moderate intensity, building up tension gradually. Keep your jaw and forehead relaxed.  Hold 10 to 20 seconds. Keep your breathing relaxed.  Release the tension slowly. Relax your neck muscles completely before you start the next repetition. Repeat __________ times. Complete this exercise __________ times per day. STRENGTH - Cervical Extensors, Isometric   Stand about 6 inches away from a wall. Place a small pillow, a ball about 6-8 inches in diameter, or a folded towel between the back of your head and the wall.  Slightly tuck your chin and gently tilt your head back into the soft object. Push only with mild to moderate intensity, building up tension gradually. Keep your jaw and forehead relaxed.  Hold 10 to 20 seconds. Keep your breathing relaxed.  Release the tension slowly. Relax your neck  muscles completely before you start the next repetition. Repeat __________ times. Complete this exercise __________ times per day. POSTURE AND BODY MECHANICS CONSIDERATIONS - Cervical Strain and Sprain Keeping correct posture when sitting, standing or completing your activities will reduce the stress put on different body tissues, allowing injured tissues a chance to heal and limiting painful experiences. The following are general guidelines for improved posture. Your physician or physical therapist will provide you with any instructions specific to your needs. While reading these guidelines, remember:  The exercises prescribed by your provider will help you have the flexibility and  strength to maintain correct postures.  The correct posture provides the optimal environment for your joints to work. All of your joints have less wear and tear when properly supported by a spine with good posture. This means you will experience a healthier, less painful body.  Correct posture must be practiced with all of your activities, especially prolonged sitting and standing. Correct posture is as important when doing repetitive low-stress activities (typing) as it is when doing a single heavy-load activity (lifting). PROLONGED STANDING WHILE SLIGHTLY LEANING FORWARD When completing a task that requires you to lean forward while standing in one place for a long time, place either foot up on a stationary 2- to 4-inch high object to help maintain the best posture. When both feet are on the ground, the low back tends to lose its slight inward curve. If this curve flattens (or becomes too large), then the back and your other joints will experience too much stress, fatigue more quickly, and can cause pain.  RESTING POSITIONS Consider which positions are most painful for you when choosing a resting position. If you have pain with flexion-based activities (sitting, bending, stooping, squatting), choose a position that allows you to rest in a less flexed posture. You would want to avoid curling into a fetal position on your side. If your pain worsens with extension-based activities (prolonged standing, working overhead), avoid resting in an extended position such as sleeping on your stomach. Most people will find more comfort when they rest with their spine in a more neutral position, neither too rounded nor too arched. Lying on a non-sagging bed on your side with a pillow between your knees, or on your back with a pillow under your knees will often provide some relief. Keep in mind, being in any one position for a prolonged period of time, no matter how correct your posture, can still lead to  stiffness. WALKING Walk with an upright posture. Your ears, shoulders, and hips should all line up. OFFICE WORK When working at a desk, create an environment that supports good, upright posture. Without extra support, muscles fatigue and lead to excessive strain on joints and other tissues. CHAIR:  A chair should be able to slide under your desk when your back makes contact with the back of the chair. This allows you to work closely.  The chair's height should allow your eyes to be level with the upper part of your monitor and your hands to be slightly lower than your elbows.  Body position:  Your feet should make contact with the floor. If this is not possible, use a foot rest.  Keep your ears over your shoulders. This will reduce stress on your neck and low back. Document Released: 11/23/2005 Document Revised: 04/09/2014 Document Reviewed: 03/07/2009 [facility] Patient Information 2015 [place], [place]. This information is not intended to replace advice given to you by your health care provider. Make sure you discuss  any questions you have with your health care provider.  

## 2015-07-05 NOTE — Assessment & Plan Note (Addendum)
History of elevated blood glucose up to 125. No HgbA1c in chart. -Pt briefly counseled (~3 minutes) on appropriate serving sizes for carbohydrates -Will get HgbA1C today

## 2015-07-05 NOTE — Progress Notes (Signed)
[facility] Phone: [phone]  Subjective:  Ms. Jessica Andrade is a 44 year old female who presents today to establish care. She is concerned today with her blood pressure and neck pain.  CHRONIC HYPERTENSION  Disease Monitoring  Blood pressure range: Does not check at home; BP today in office is 172/85, recheck 152/78  Chest pain: no   Dyspnea: no   Claudication: no   Medication compliance: Does not take BP medications while fasting. Just finished fasting for Ramadan. Just started taking her BP medications again this morning.  Medication Side Effects  Lightheadedness: no   Urinary frequency: yes, when she takes diuretic  Edema: yes, comes and goes   Preventitive Healthcare:  Exercise: no   Diet Pattern: soups, vegetables, bread, fruit, lamb, beef, oatmeal  Salt Restriction: Tries to consume a low sodium diet   HYPERLIPIDEMIA -History of elevated LDL in the past -Has not had a Lipid Panel in the last few years.  Disease Monitoring: See symptoms for Hypertension'  Diet: Has been trying to keep cholesterol down by eating a low fat diet.  Medications: Compliance- Does not currently take any medications  HYPERGLYCEMIA: -Pt with a history of hyperglycemia. States she is "borderline". Has never been on any medications for hyperglycemia. Does not check blood sugar at home. Consumes oatmeal for breakfast, bread throughout the day, and a lot of fruit throughout the day.  NECK PAIN Has been going on for the last 3 months. Onset was gradual. Pain mostly located in trapezius muscles bilaterally. Is a "burning pain". Pain comes and goes. Worse with walking, doing the dishes, sitting for a long time with her neck flexed looking at her phone. Nothing makes it better. Tried using a neck pillow while sitting but it didn't help. Pain radiates up to top of the head. Pain ranges from 7-10 in severity. Associated with numbness in L hand and fingertips. No trauma to the neck.  No recent motor vehicle accident. No muscle weakness, no fevers, no chills, no unintentional weight loss.   All other ROS were reviewed and are negative unless otherwise noted in the HPI. Past Medical History- significant for HTN, borderline HLD, borderline DM Reviewed problem list.  Medications- reviewed and updated Current Outpatient Prescriptions  Medication Sig Dispense Refill  . amLODipine (NORVASC) 5 MG tablet Take 1 tablet (5 mg total) by mouth daily. 30 tablet 0  . amLODipine (NORVASC) 5 MG tablet Take 1 tablet (5 mg total) by mouth daily. 30 tablet 0  . aspirin 81 MG tablet Take 81 mg by mouth daily.    Marland Kitchen atenolol (TENORMIN) 50 MG tablet Take 50 mg by mouth daily.    Marland Kitchen atenolol (TENORMIN) 50 MG tablet Take 1 tablet (50 mg total) by mouth daily. 30 tablet 1  . ipratropium (ATROVENT) 0.03 % nasal spray Place 2 sprays into the nose every 12 (twelve) hours. 30 mL 12  . loratadine (CLARITIN) 10 MG tablet Take 1 tablet (10 mg total) by mouth daily. 20 tablet 0   No current facility-administered medications for this visit.   Chief complaint-noted Family history reviewed for today's visit. Added HTN in father. No family history of diabetes or stroke. Social history- patient is a never smoker. Does not use alcohol or recreational drugs.  Objective: BP 172/85 mmHg  Pulse 71  Temp(Src) 98.5 F (36.9 C) (Oral)  Ht  (1.549 m)  Wt 150 lb (68.04 kg)  BMI 28.36 kg/m2 Gen: NAD, alert, cooperative with exam, Pt sitting  with shoulders hunched forward and neck flexed. HEENT: NCAT, EOMI, PERRL Neck: Decreased ROM, pain worse with extension, trapezius muscles are very tense bilaterally, L cervical paraspinal muscle is tender to palpation, no tenderness to palpation of spinous processes CV: RRR, good S1/S2, no murmur Resp: CTABL, no wheezes, non-labored GI: SNTND, BS present, no guarding or organomegaly Msk: 2+ pitting edema in feet and ankles bialterally, moves UE/LE spontaneously, 5/5  muscle strength in upper extremities bilaterally Neuro: Alert and oriented, sensation is normal in upper extremities bilaterally Skin: no rashes, no lesions Psych: Appropriate behavior  Assessment/Plan: See problem based a/p  Health maintenance:  -Pt to follow-up in 1 month for Pap. Has not had one since 2012.  -No history of breast cancer in her family, so will hold off on mammogram until she is 44 years old. -Up to date on immunizations -Pt has annual eye exam scheduled for 07/15/15  Willadean Carol, MD PGY-1

## 2015-07-05 NOTE — Assessment & Plan Note (Signed)
Pt with neck pain for the last 3 months. Trapezius and paraspinal muscles are tight and tender to palpation on exam. Neck pain is likely muscular in origin. No red flags. No further workup indicated at this time. -If pain persists or begins to be associated with upper extremity muscle weakness, will consider future imaging -Pt advised to place heat on the neck and can take Tylenol as needed for pain -Pt given neck exercises to perform at home

## 2015-07-05 NOTE — Assessment & Plan Note (Addendum)
Pt with a history of LDL of 125. Has been trying to manage with diet. -Will get a Lipid Panel today. -Consider starting a statin in the future depending on results of lipid panel

## 2015-07-05 NOTE — Assessment & Plan Note (Addendum)
Hypertension uncontrolled today. BP 172/85 today in office, 152/78 on recheck. Has not taken BP medications in the last month because fasting for Ramadan. Just started taking medications this morning. -Because she has not been taking BP meds in the last month, will keep medication doses as prescribed for now. -Continue Norvasc  qd -Will refill Atenolol/Chlorthalidone -  qd -Pt advised to check blood pressures at home and keep a log for next visit -Pt encouraged to continue to consume a low salt diet -Will get CMet today to monitor kidney function -Pt to follow-up in 1 month for follow-up of HTN

## 2015-07-12 ENCOUNTER — Encounter: Payer: Self-pay | Admitting: Internal Medicine

## 2015-08-05 ENCOUNTER — Encounter: Payer: Self-pay | Admitting: Family Medicine

## 2015-08-05 ENCOUNTER — Ambulatory Visit (INDEPENDENT_AMBULATORY_CARE_PROVIDER_SITE_OTHER): Payer: Medicaid Other | Admitting: Family Medicine

## 2015-08-05 VITALS — BP 125/72 | HR 69 | Temp 98.2°F | Ht 61.0 in | Wt 151.2 lb

## 2015-08-05 DIAGNOSIS — M545 Low back pain: Secondary | ICD-10-CM | POA: Diagnosis not present

## 2015-08-05 DIAGNOSIS — H538 Other visual disturbances: Secondary | ICD-10-CM

## 2015-08-05 NOTE — Progress Notes (Signed)
   Subjective:   Jessica Andrade is a 44 y.o. female with a history of HTN presenting for blurry vision and follow up HTN #Blurry vision: L>R. Needs MD referral for eye exam. She reports progressive worsening. No pain. Mild redness in both eyes. Ears glasses.  #HTN: reports compliance with medications. No CP, SOB, swelling  Health Maintenance Due  Topic Date Due  . INFLUENZA VACCINE  07/08/2015   Review of Systems:   Per HPI. All other systems reviewed and are negative expect as in HPI   PMH, PSH, Medications, Allergies, SocialHx and FHx reviewed and updated in EMR- marked as reviewed 08/05/2015   Objective:  BP 125/72 mmHg  Pulse 69  Temp(Src) 98.2 F (36.8 C) (Oral)  Ht  (1.549 m)  Wt 151 lb 3.2 oz (68.584 kg)  BMI 28.58 kg/m2  Gen:  44 y.o. female in NAD. Speaking in full sentences. Good eye contact HEENT: NCAT, MMM, EOMI, PERRL, anicteric sclerae.  CV: RRR, no MRG, no JVD Resp: Normal WOB. Non-labored, CTAB, no wheezes noted GI: Soft, NTND, BS present, no guarding or organomegaly Ext: WWP, no edema MSK: Intact gait. Full ROM Neuro: Alert and oriented, speech normal Pysch: Normal mood and affect.       Chemistry      Component Value Date/Time   NA 140 07/05/2015 1027   K 3.8 07/05/2015 1027   CL 100 07/05/2015 1027   CO2 31 07/05/2015 1027   BUN 13 07/05/2015 1027   CREATININE 0.61 07/05/2015 1027   CREATININE 0.60 01/30/2011 2128      Component Value Date/Time   CALCIUM 9.8 07/05/2015 1027   ALKPHOS 50 07/05/2015 1027   AST 15 07/05/2015 1027   ALT 13 07/05/2015 1027   BILITOT 0.3 07/05/2015 1027      Lab Results  Component Value Date   HGBA1C 5.5 07/05/2015   Assessment:     Jessica Andrade is a 44 y.o. female here for  Eye MD referral and follow up HTN.    Plan:   #HTN- well controlled today. No changed  #blurry vision: referral placed to [facility] for evaluation  #HCM: reschedule for PAP with pcp  Orders Placed This  Encounter  Procedures  . Ambulatory referral to Ophthalmology    Referral Priority:  Routine    Referral Type:  Consultation    Referral Reason:  Specialty Services Required    Referred to Provider:  Chalmers Guest, MD    Requested Specialty:  Ophthalmology    Number of Visits Requested:  1    Federico Flake, MD, ABFM [facility] Fellow, Faculty Practice 08/05/2015  3:44 PM

## 2015-08-16 ENCOUNTER — Ambulatory Visit: Payer: Medicaid Other | Admitting: Internal Medicine

## 2015-08-26 ENCOUNTER — Encounter: Payer: Self-pay | Admitting: Internal Medicine

## 2015-08-26 ENCOUNTER — Other Ambulatory Visit (HOSPITAL_COMMUNITY)
Admission: RE | Admit: 2015-08-26 | Discharge: 2015-08-26 | Disposition: A | Discharge status: Home / Self Care | Payer: Medicaid Other | Source: Ambulatory Visit | Attending: Family Medicine | Admitting: Family Medicine

## 2015-08-26 ENCOUNTER — Ambulatory Visit (INDEPENDENT_AMBULATORY_CARE_PROVIDER_SITE_OTHER): Payer: Medicaid Other | Admitting: Internal Medicine

## 2015-08-26 VITALS — BP 137/73 | HR 74 | Temp 98.3°F | Ht 61.0 in | Wt 152.0 lb

## 2015-08-26 DIAGNOSIS — Z1151 Encounter for screening for human papillomavirus (HPV): Secondary | ICD-10-CM | POA: Insufficient documentation

## 2015-08-26 DIAGNOSIS — Z124 Encounter for screening for malignant neoplasm of cervix: Secondary | ICD-10-CM | POA: Diagnosis present

## 2015-08-26 DIAGNOSIS — Z01419 Encounter for gynecological examination (general) (routine) without abnormal findings: Secondary | ICD-10-CM | POA: Insufficient documentation

## 2015-08-26 NOTE — Patient Instructions (Signed)
It was so nice to see you again!   I will send you your results in the mail. If you don't hear from [place] in 2 weeks, please call our office.  -Dr. Nancy Marus

## 2015-08-26 NOTE — Progress Notes (Signed)
  Subjective:     KALIFA CADDEN is a 44 y.o. woman who comes in today for a  pap smear only. Her most recent annual exam was on 07/05/15. Her most recent Pap smear was on 02/03/2011 and showed no abnormalities. Previous abnormal Pap smears: no. Contraception: none  The following portions of the patient's history were reviewed and updated as appropriate: allergies, current medications, past family history, past medical history, past social history, past surgical history and problem list.  Review of Systems A comprehensive review of systems was negative.   Objective:    BP 137/73 mmHg  Pulse 74  Temp(Src) 98.3 F (36.8 C) (Oral)  Ht  (1.549 m)  Wt 152 lb (68.947 kg)  BMI 28.74 kg/m2  General: Well-appearing female, in NAD, pleasant and conversational HEENT: [place]/AT, EOMI, MMM Resp: Normal work of breathing Pelvic Exam: cervix normal in appearance, external genitalia normal and vagina normal without discharge. Pap smear obtained.  No ovarian masses were palpated on bimanual exam. Msk: No edema Neuro: Alert, oriented  Assessment:    Screening pap smear.   Plan:    Follow up in 1 year, or as indicated by Pap results.

## 2015-08-28 LAB — CYTOLOGY - PAP

## 2015-09-05 ENCOUNTER — Encounter: Payer: Self-pay | Admitting: Internal Medicine

## 2015-11-19 ENCOUNTER — Ambulatory Visit (INDEPENDENT_AMBULATORY_CARE_PROVIDER_SITE_OTHER): Payer: Medicaid Other | Admitting: Internal Medicine

## 2015-11-19 ENCOUNTER — Encounter: Payer: Self-pay | Admitting: Internal Medicine

## 2015-11-19 VITALS — BP 131/68 | HR 66 | Temp 99.0°F | Wt 152.9 lb

## 2015-11-19 DIAGNOSIS — R739 Hyperglycemia, unspecified: Secondary | ICD-10-CM

## 2015-11-19 DIAGNOSIS — I10 Essential (primary) hypertension: Secondary | ICD-10-CM | POA: Diagnosis not present

## 2015-11-19 DIAGNOSIS — H539 Unspecified visual disturbance: Secondary | ICD-10-CM

## 2015-11-19 LAB — POCT GLYCOSYLATED HEMOGLOBIN (HGB A1C): Hemoglobin A1C: 5.4

## 2015-11-19 MED ORDER — AMLODIPINE BESYLATE 5 MG PO TABS: 5.0000 mg | ORAL_TABLET | Freq: Every day | ORAL | Status: DC

## 2015-11-19 MED ORDER — ATENOLOL-CHLORTHALIDONE 50-25 MG PO TABS: 1.0000 | ORAL_TABLET | Freq: Every day | ORAL | Status: DC

## 2015-11-19 NOTE — Assessment & Plan Note (Addendum)
Well-controlled. BP 139/74 in the office today, 131/68 on recheck. Goal < 140/ < 90. She just had her annual BMET in 06/2015. Current regimen: Atenolol-Chlorthalidone 50-25mg  daily and Norvasc 5mg  daily. - Will refill her Atenolol-Chlorthalidone and Norvasc today - Encouraged Pt to continue to exercise daily - Follow-up in 6 months or sooner if needed

## 2015-11-19 NOTE — Progress Notes (Signed)
   [facility] Phone: [phone]  Subjective:  Hypertension: She used to check her blood pressure, but her blood pressure cuff was broken. She has not checked her blood pressures recently. Blood pressure today in the office was 139/74, 131/68 on recheck. Currently takes Norvasc 5mg  daily and Atenolol-Chlorthalidone 50-25mg  daily. She takes her medications every day. She walks around a lot at her job. No chest pain. No shortness of breath. Sometimes has palpitations, once every few months. She endorses some swelling in her feet when she spends a lot of time standing. The swelling goes down after she lays down at night.   Hyperglycemia: She has a history of borderline diabetes. She has been trying to eat foods low in sugar. No polyuria or polydipsia. Last A1c was 5.5%. A1c today was 5.4%.  See HPI for pertinent positives and negatives. Past Medical History- significant for hypertension,  Reviewed problem list.  Medications- reviewed and updated Current Outpatient Prescriptions  Medication Sig Dispense Refill  . amLODipine (NORVASC) 5 MG tablet Take 1 tablet (5 mg total) by mouth daily. 30 tablet 0  . aspirin 81 MG tablet Take 81 mg by mouth daily.    Marland Kitchen. atenolol-chlorthalidone (TENORETIC) 50-25 MG per tablet Take 1 tablet by mouth daily. 30 tablet 3   No current facility-administered medications for this visit.   Chief complaint-noted Family history reviewed for today's visit. No changes. Social history- patient is a never smoker  Objective: BP 139/74 mmHg  Pulse 67  Temp(Src) 99 F (37.2 C) (Oral)  Wt 69.355 kg (152 lb 14.4 oz) Gen: NAD, alert, cooperative with exam HEENT: NCAT, EOMI, MMM Neck: FROM, supple CV: RRR, good S1/S2, no murmur Resp: CTABL, no wheezes, non-labored GI: SNTND, BS present, no guarding or organomegaly Msk: 1+ edema in the feet and ankles bilaterally, warm, normal tone, moves UE/LE spontaneously Neuro: Alert and oriented, no gross  deficits Skin: no rashes, no lesions Psych: Appropriate behavior  Assessment/Plan: See problem based a/p   Jessica CarolKaty Myasia Sinatra, MD PGY-1

## 2015-11-19 NOTE — Assessment & Plan Note (Addendum)
Has a history of borderline diabetes. Last HgbA1c was 5.5%. Recheck today was 5.4%.  - Encouraged Pt to continue to cut back on sweets and sugar-sweetened beverages - Encouraged regular exercise - Follow-up in 6 months or earlier if needed

## 2015-11-19 NOTE — Patient Instructions (Signed)
It was wonderful to see you today! You are doing a great job keeping your blood pressure and blood sugar under control. Please keep exercising once a day.  I have sent your medications into the pharmacy.  Please see me back for a follow-up appointment in 6 months or earlier if needed.  -Dr. Nancy MarusMayo

## 2016-04-22 ENCOUNTER — Other Ambulatory Visit: Payer: Self-pay | Admitting: Internal Medicine

## 2016-04-22 DIAGNOSIS — I10 Essential (primary) hypertension: Secondary | ICD-10-CM

## 2016-04-22 NOTE — Telephone Encounter (Signed)
Also needs norvasc for 3 months.

## 2016-04-22 NOTE — Telephone Encounter (Signed)
Needs refill on atenolol.  She needs  3 month supply because she is traveling  [facility]   [address]

## 2016-04-23 MED ORDER — ATENOLOL-CHLORTHALIDONE 50-25 MG PO TABS: 1.0000 | ORAL_TABLET | Freq: Every day | ORAL | Status: DC

## 2016-04-23 MED ORDER — AMLODIPINE BESYLATE 5 MG PO TABS
5.0000 mg | ORAL_TABLET | Freq: Every day | ORAL | Status: DC
Start: 2016-04-23 — End: 2020-02-17

## 2016-04-23 MED ORDER — AMLODIPINE BESYLATE 5 MG PO TABS: 5.0000 mg | ORAL_TABLET | Freq: Every day | ORAL | Status: DC

## 2016-04-23 NOTE — Telephone Encounter (Signed)
Medications refilled

## 2016-04-23 NOTE — Telephone Encounter (Signed)
Resent scripts for 90 days. Jazmin Hartsell,CMA

## 2016-04-23 NOTE — Addendum Note (Signed)
Addended by: Henri MedalHARTSELL, JAZMIN M on: 04/23/2016 07:54 AM   Modules accepted: Orders

## 2020-02-17 ENCOUNTER — Encounter (HOSPITAL_COMMUNITY): Payer: Self-pay | Admitting: *Deleted

## 2020-02-17 ENCOUNTER — Other Ambulatory Visit: Payer: Self-pay

## 2020-02-17 ENCOUNTER — Ambulatory Visit (HOSPITAL_COMMUNITY)
Admission: EM | Admit: 2020-02-17 | Discharge: 2020-02-17 | Disposition: A | Discharge status: Home / Self Care | Payer: Self-pay | Attending: Family Medicine | Admitting: Family Medicine

## 2020-02-17 DIAGNOSIS — M5412 Radiculopathy, cervical region: Secondary | ICD-10-CM

## 2020-02-17 HISTORY — DX: Other intervertebral disc displacement, lumbar region: M51.26

## 2020-02-17 HISTORY — DX: Pure hypercholesterolemia, unspecified: E78.00

## 2020-02-17 MED ORDER — HYDROCHLOROTHIAZIDE 12.5 MG PO TABS: 12.5000 mg | ORAL_TABLET | Freq: Every day | ORAL | 3 refills | Status: DC

## 2020-02-17 MED ORDER — AMLODIPINE BESYLATE 5 MG PO TABS: 5.0000 mg | ORAL_TABLET | Freq: Every day | ORAL | 3 refills | Status: DC

## 2020-02-17 MED ORDER — PREDNISONE 50 MG PO TABS: ORAL_TABLET | ORAL | 1 refills | Status: DC

## 2020-02-17 NOTE — ED Triage Notes (Signed)
Denies injury.  C/O left lateral neck pain radiating all the way down into LUE, including 3rd & 4th fingers.  C/O painful lifting of LUE.

## 2020-02-17 NOTE — ED Provider Notes (Addendum)
[facility]    CSN: [id number] Arrival date & time: 02/17/20  1132      History   Chief Complaint Chief Complaint  Patient presents with  . Neck Pain    HPI Jessica Andrade is a 49 y.o. female.   This is a 49 year old woman making an initial visit to [facility] urgent care, complaining about left neck and left upper extremity pain.  She has a h/o HTN.  She denies injury.  C/O left lateral neck pain radiating all the way down into LUE, including 3rd & 4th fingers.  C/O painful lifting of LUE.  She experienced some relief with ibuprofen, but pain comes back after a couple hours.  She has had a lumbar disc problem in the past.  She is able to sleep, but finds that she must continue to adjust position of pillow.  Has occasional numbness in ring and middle fingers.  Primary area of pain is left base of cervical spine.  No weakness in LUE.  Patient also requests refill on BP medication     Past Medical History:  Diagnosis Date  . Hypercholesteremia   . Hypertension   . Lumbar herniated disc     Patient Active Problem List   Diagnosis Date Noted  . Hyperglycemia 07/05/2015    07/05/2015  . Neck pain 07/05/2015  . HEMATURIA UNSPECIFIED 12/23/2007  . BACK PAIN, LUMBAR 12/23/2007  . PTERYGIUM 09/19/2007  . Essential hypertension 09/19/2007    Past Surgical History:  Procedure Laterality Date  . CESAREAN SECTION      OB History   No obstetric history on file.      Home Medications    Prior to Admission medications   Medication Sig Start Date End Date Taking? Authorizing Provider  aspirin 81 MG tablet Take 81 mg by mouth daily.   Yes [provider]  hydrochlorothiazide (HYDRODIURIL) 12.5 MG tablet Take 12.5 mg by mouth daily.   Yes [provider]  amLODipine (NORVASC) 5 MG tablet Take 1 tablet (5 mg total) by mouth daily. Patient taking differently: Take 2.5 mg by mouth daily.  04/23/16 02/17/20 Yes Mayo, Allyn Kenner, MD  predniSONE  (DELTASONE) 50 MG tablet One daily with food 02/17/20   Elvina Sidle, MD  atenolol-chlorthalidone (TENORETIC) 50-25 MG tablet Take 1 tablet by mouth daily. Patient not taking: Reported on 02/17/2020 04/23/16 02/17/20  Mayo, Allyn Kenner, MD    Family History Family History  Problem Relation Age of Onset  . Hypertension Father   . Heart attack Father   . Healthy Mother     Social History Social History   Tobacco Use  . Smoking status: Never Smoker  Substance Use Topics  . Alcohol use: No  . Drug use: No     Allergies   Lisinopril   Review of Systems Review of Systems  Musculoskeletal: Positive for neck pain.  Neurological: Positive for numbness.  All other systems reviewed and are negative.    Physical Exam Triage Vital Signs ED Triage Vitals  Enc Vitals Group     BP      Pulse      Resp      Temp      Temp src      SpO2      Weight      Height      Head Circumference      Peak Flow      Pain Score      Pain Loc  Pain Edu?      Excl. in [place]?    No data found.  Updated Vital Signs BP 134/84   Pulse 84   Temp 98.5 F (36.9 C) (Oral)   Resp 16   LMP 02/15/2020 (Approximate)   SpO2 98%    Physical Exam Vitals and nursing note reviewed.  Constitutional:      General: She is not in acute distress.    Appearance: Normal appearance. She is normal weight. She is not ill-appearing or toxic-appearing.  HENT:     Head: Normocephalic.  Eyes:     Conjunctiva/sclera: Conjunctivae normal.  Cardiovascular:     Rate and Rhythm: Normal rate.  Pulmonary:     Effort: Pulmonary effort is normal.  Musculoskeletal:        General: No deformity. Normal range of motion.     Cervical back: Normal range of motion and neck supple.     Comments: Tender left lateral C7 area.  Good strength and ROM of left hand.  Skin:    General: Skin is warm and dry.  Neurological:     General: No focal deficit present.     Mental Status: She is alert.  Psychiatric:         Mood and Affect: Mood normal.        Behavior: Behavior normal.        Thought Content: Thought content normal.        Judgment: Judgment normal.      UC Treatments / Results  none  Initial Impression / Assessment and Plan / UC Course  I have reviewed the triage vital signs and the nursing notes.  Pertinent labs & imaging results that were available during my care of the patient were reviewed by me and considered in my medical decision making (see chart for details).    Final Clinical Impressions(s) / UC Diagnoses   Final diagnoses:  Cervical radiculopathy     Discharge Instructions     Follow up with a primary care doctor to check the cholesterol and see how this problem is doing.    ED Prescriptions    Medication Sig Dispense Auth. Provider   predniSONE (DELTASONE) 50 MG tablet One daily with food 5 tablet Robyn Haber, MD     I have reviewed the PDMP during this encounter.   Robyn Haber, MD 02/17/20 1411    Robyn Haber, MD 02/17/20 1418

## 2020-02-17 NOTE — Discharge Instructions (Addendum)
Follow up with a primary care doctor to check the cholesterol and see how this problem is doing.

## 2020-06-18 ENCOUNTER — Encounter: Payer: Self-pay | Admitting: Family Medicine

## 2020-06-18 ENCOUNTER — Other Ambulatory Visit: Payer: Self-pay

## 2020-06-18 ENCOUNTER — Ambulatory Visit (INDEPENDENT_AMBULATORY_CARE_PROVIDER_SITE_OTHER): Payer: 59 | Admitting: Family Medicine

## 2020-06-18 VITALS — BP 122/70 | HR 87 | Ht 60.0 in | Wt 141.4 lb

## 2020-06-18 DIAGNOSIS — Z131 Encounter for screening for diabetes mellitus: Secondary | ICD-10-CM

## 2020-06-18 DIAGNOSIS — M5386 Other specified dorsopathies, lumbar region: Secondary | ICD-10-CM

## 2020-06-18 DIAGNOSIS — E785 Hyperlipidemia, unspecified: Secondary | ICD-10-CM

## 2020-06-18 DIAGNOSIS — Z1159 Encounter for screening for other viral diseases: Secondary | ICD-10-CM | POA: Diagnosis not present

## 2020-06-18 DIAGNOSIS — Z0000 Encounter for general adult medical examination without abnormal findings: Secondary | ICD-10-CM

## 2020-06-18 DIAGNOSIS — I10 Essential (primary) hypertension: Secondary | ICD-10-CM

## 2020-06-18 DIAGNOSIS — H11431 Conjunctival hyperemia, right eye: Secondary | ICD-10-CM | POA: Insufficient documentation

## 2020-06-18 DIAGNOSIS — H579 Unspecified disorder of eye and adnexa: Secondary | ICD-10-CM

## 2020-06-18 LAB — POCT GLYCOSYLATED HEMOGLOBIN (HGB A1C): Hemoglobin A1C: 5.4 % (ref 4.0–5.6)

## 2020-06-18 NOTE — Assessment & Plan Note (Signed)
Patient has noticed that she sometimes has something "swimming" in her vision, which sounds like a floater. She wears glasses and has not been to see an ophthalmologist in some time, she requests a referral. - ambulatory referral to ophthalmology

## 2020-06-18 NOTE — Assessment & Plan Note (Signed)
Last screening was WNL in 2016, but due to age and family history, will obtain lipid panel today.

## 2020-06-18 NOTE — Assessment & Plan Note (Signed)
Patient has long history of lumbar pain, specifically L4. She reports her feet feeling "hot" or "burning" specifically at night. With (+) SLR and this history and a normal foot exam with sensation and DTRs intact, suspect radiculopathy is the cause of these symptoms. Offered patient treatment with core exercises to support her back vs gabapentin vs referral to PT or sports medicine/ortho for injection. She opts for conservative management and doing core exercises at this time. No red flag symptoms.

## 2020-06-18 NOTE — Progress Notes (Signed)
SUBJECTIVE:   CHIEF COMPLAINT / HPI: re-establish care  Jessica Andrade is a 49 yo woman w/ PMH of HTN, lumbar and cervical back pain presenting to re-establish care. Last time she was seen at the clinic was 2016.  HTN: patient currently takes amlodipine 5mg  and HCTZ 12.5mg  daily. BP today is appropriate at 120/72. She has not had lab work done since 2016, will check BMP today. She denies dizziness, lightheadedness, falls, headaches.   HLD: patient has not been screened for HLD since 2016, then total cholesterol 152, HDL 39, LDL 84, triglycerides 143.  Patient reports that she is worried about hyperlipidemia as her father died of an MI at age 32.  She would like to have this checked.  Eye complaint: patient remarks that she feels her vision in her right eye sometimes "swimming."  She also feels like she has floaters, "little things moving."  She requests referral to ophthalmology.  Foot complaint: Patient reports that at night sometimes she feels like her left foot gets particularly hot, feels like a burning pain.  So far she is use olive oil on it.  She does have a history of chronic lumbar pain, she reports having a history of problems with her L4 joint. She denies incontinence or saddle anesthesia.  Social hx: immigrated from [place], has 1 adult son, lives with husband and son. Never smoker, does not use recreational drugs, no EtOH. She exercises by walking several times a week. Fam hx: no cancer that she is aware of, father passed away at age 61 from MI, family hx of HTN, HLD. Mother is alive and well at 43.  PERTINENT  PMH / PSH: Hypertension, chronic lumbar and cervical back pain  OBJECTIVE:   BP 122/70   Pulse 87   Ht 5' (1.524 m)   Wt 141 lb 6.4 oz (64.1 kg)   SpO2 97%   BMI 27.62 kg/m   Physical Exam Vitals and nursing note reviewed.  Constitutional:      General: She is not in acute distress.    Appearance: Normal appearance. She is normal weight. She is not  ill-appearing, toxic-appearing or diaphoretic.  HENT:     Head: Normocephalic and atraumatic.     Nose: Nose normal.     Mouth/Throat:     Mouth: Mucous membranes are moist.     Pharynx: Oropharynx is clear.  Eyes:     Conjunctiva/sclera: Conjunctivae normal.     Pupils: Pupils are equal, round, and reactive to light.  Cardiovascular:     Rate and Rhythm: Normal rate and regular rhythm.     Pulses: Normal pulses.     Heart sounds: Normal heart sounds. No murmur heard.   Pulmonary:     Effort: Pulmonary effort is normal.  Musculoskeletal:        General: Normal range of motion.     Cervical back: Neck supple.     Right lower leg: No edema.     Left lower leg: No edema.     Comments: SLR (+) on left, negative on right. No step offs on central column of lumbar spine. No paraspinal muscle tenderness  Skin:    General: Skin is warm and dry.  Neurological:     General: No focal deficit present.     Mental Status: She is alert. Mental status is at baseline.  Psychiatric:        Mood and Affect: Mood normal.        Behavior: Behavior  normal.    Diabetic Foot Exam - Simple   Simple Foot Form Diabetic Foot exam was performed with the following findings: Yes 06/18/2020  8:45 AM  Visual Inspection No deformities, no ulcerations, no other skin breakdown bilaterally: Yes Sensation Testing Intact to touch and monofilament testing bilaterally: Yes Pulse Check Posterior Tibialis and Dorsalis pulse intact bilaterally: Yes Comments Achilles DTR intact. Normal exam.    ASSESSMENT/PLAN:   Essential hypertension Hypertension: Patient is well controlled and at goal with BP of 120/72 today. Goal is <140/90. - Medications: HCTZ 12.5mg , Amlodipine 5mg   - Compliance: very good - Checking BP at home: not doing - Denies any SOB, CP, LE edema, medication SEs, or symptoms of hypotension - Diet: varied - Exercise: walking - Will check BMP today as last one was in 2016   Sciatica associated  with disorder of lumbar spine Patient has long history of lumbar pain, specifically L4. She reports her feet feeling "hot" or "burning" specifically at night. With (+) SLR and this history and a normal foot exam with sensation and DTRs intact, suspect radiculopathy is the cause of these symptoms. Offered patient treatment with core exercises to support her back vs gabapentin vs referral to PT or sports medicine/ortho for injection. She opts for conservative management and doing core exercises at this time. No red flag symptoms.  Borderline hyperlipidemia Last screening was WNL in 2016, but due to age and family history, will obtain lipid panel today.  Healthcare maintenance - Recommend patient return for pap smear screening this year as the last was only cytology in 2016, she expressed understanding -Hep C screening performed today  -Patient opts to begin mammography at 51, no fam hx of breast cancer  Eye problem Patient has noticed that she sometimes has something "swimming" in her vision, which sounds like a floater. She wears glasses and has not been to see an ophthalmologist in some time, she requests a referral. - ambulatory referral to ophthalmology     2017, MD [facility]

## 2020-06-18 NOTE — Patient Instructions (Addendum)
It was a pleasure to meet you!  Your blood pressure was very good today! We will check your blood for cholesterol, sugar, and electrolytes (sodium, potassium, chloride, etc). I will call you with results if it is abnormal. If we need to make any changes in medication, I will let you know.  This year we should have a follow up appointment to do a pap smear to screen for cervical cancer, so please schedule that at your convenience.  For your foot problem, I recommend doing core exercises such as the "plank" or the "dead bug", you can find examples here: [email or web address]  I have referred you to an eye doctor, an ophthalmologist, for your eye complaint.  Be Well!  Dr. Leary Roca

## 2020-06-18 NOTE — Assessment & Plan Note (Addendum)
Hypertension: Patient is well controlled and at goal with BP of 120/72 today. Goal is <140/90. - Medications: HCTZ 12.5mg , Amlodipine 5mg   - Compliance: very good - Checking BP at home: not doing - Denies any SOB, CP, LE edema, medication SEs, or symptoms of hypotension - Diet: varied - Exercise: walking - Will check BMP today as last one was in 2016

## 2020-06-18 NOTE — Assessment & Plan Note (Signed)
-   Recommend patient return for pap smear screening this year as the last was only cytology in 2016, she expressed understanding -Hep C screening performed today  -Patient opts to begin mammography at 51, no fam hx of breast cancer

## 2020-06-19 LAB — BASIC METABOLIC PANEL
BUN/Creatinine Ratio: 20 (ref 9–23)
BUN: 14 mg/dL (ref 6–24)
CO2: 21 mmol/L (ref 20–29)
Calcium: 9.5 mg/dL (ref 8.7–10.2)
Chloride: 100 mmol/L (ref 96–106)
Creatinine, Ser: 0.71 mg/dL (ref 0.57–1.00)
GFR calc Af Amer: 116 mL/min/{1.73_m2} (ref >59)
GFR calc non Af Amer: 101 mL/min/{1.73_m2} (ref >59)
Glucose: 173 mg/dL — ABNORMAL HIGH (ref 65–99)
Potassium: 3.4 mmol/L — ABNORMAL LOW (ref 3.5–5.2)
Sodium: 141 mmol/L (ref 134–144)

## 2020-06-19 LAB — LIPID PANEL
Chol/HDL Ratio: 3.5 ratio (ref 0.0–4.4)
Cholesterol, Total: 197 mg/dL (ref 100–199)
HDL: 57 mg/dL (ref >39)
LDL Chol Calc (NIH): 114 mg/dL — ABNORMAL HIGH (ref 0–99)
Triglycerides: 148 mg/dL (ref 0–149)
VLDL Cholesterol Cal: 26 mg/dL (ref 5–40)

## 2020-06-19 LAB — HCV AB W REFLEX TO QUANT PCR: HCV Ab: <0.1 s/co ratio (ref 0.0–0.9)

## 2020-06-19 LAB — HCV INTERPRETATION

## 2020-07-10 ENCOUNTER — Other Ambulatory Visit: Payer: Self-pay

## 2020-07-10 ENCOUNTER — Ambulatory Visit (INDEPENDENT_AMBULATORY_CARE_PROVIDER_SITE_OTHER): Payer: 59 | Admitting: Family Medicine

## 2020-07-10 ENCOUNTER — Telehealth: Payer: Self-pay | Admitting: Family Medicine

## 2020-07-10 ENCOUNTER — Other Ambulatory Visit (HOSPITAL_COMMUNITY)
Admission: RE | Admit: 2020-07-10 | Discharge: 2020-07-10 | Disposition: A | Discharge status: Home / Self Care | Payer: 59 | Source: Ambulatory Visit | Attending: Family Medicine | Admitting: Family Medicine

## 2020-07-10 ENCOUNTER — Encounter: Payer: Self-pay | Admitting: Family Medicine

## 2020-07-10 DIAGNOSIS — Z124 Encounter for screening for malignant neoplasm of cervix: Secondary | ICD-10-CM | POA: Insufficient documentation

## 2020-07-10 DIAGNOSIS — N90812 Female genital mutilation Type II status: Secondary | ICD-10-CM | POA: Diagnosis not present

## 2020-07-10 DIAGNOSIS — Z0000 Encounter for general adult medical examination without abnormal findings: Secondary | ICD-10-CM

## 2020-07-10 DIAGNOSIS — E785 Hyperlipidemia, unspecified: Secondary | ICD-10-CM

## 2020-07-10 DIAGNOSIS — H579 Unspecified disorder of eye and adnexa: Secondary | ICD-10-CM | POA: Diagnosis not present

## 2020-07-10 DIAGNOSIS — Z01419 Encounter for gynecological examination (general) (routine) without abnormal findings: Secondary | ICD-10-CM

## 2020-07-10 NOTE — Telephone Encounter (Signed)
Attempted to reach pt. LVM to inform her that. The number that Dr. Leary Roca gave her to her insurance company, is the number that she will have to call. Not [place].If she lost the number she can also find that number on the back of her insurance card.  She will have to call them and ask the insurance company to give her any eye doctor that is coverage by her insurance. Aquilla Solian, CMA

## 2020-07-10 NOTE — Telephone Encounter (Signed)
Patient came back in office states that eye doctor that you referred her to isn't taking new patients.  Dr name is Amy Wall.  She needs you to refer to another eye doctor she can see someone [phone]

## 2020-07-10 NOTE — Patient Instructions (Signed)
It was a pleasure to see you today!  Keep up the good work with a healthy diet and taking your blood pressure medication. Your cholesterol (fat in your blood) was good. We will keep watching this. I recommend you start exercising. Try walking for 30 minutes 3 times per week.  For the tests we did today, I will call you with any abnormal results.  For your eye problem: please call one of the providers on the list given, or look for "ophthalmology" on [email or web address] to find a list of providers.  You can come back and see me as you need to or in 1 year.  Be Well,  Dr. Leary Roca   !             .   (  ) .     .     .    30  3   .              .   :             " "  [email or web address]     .          Marland Kitchen

## 2020-07-11 NOTE — Telephone Encounter (Signed)
Spoke with pt. Informed her that she would have to call the insurance company to find out what eye doctor will take her insurance. Pt insisted that we call her insurance company to find this out because it would be more convenient for [place] to do this. I told her that we wouldn't be able to do this. She then said that we should because she don't have the time, and hung up the phone on me. Aquilla Solian, CMA

## 2020-07-12 LAB — CYTOLOGY - PAP
Comment: NEGATIVE
Diagnosis: NEGATIVE
High risk HPV: NEGATIVE

## 2020-07-15 DIAGNOSIS — N90812 Female genital mutilation Type II status: Secondary | ICD-10-CM | POA: Insufficient documentation

## 2020-07-15 NOTE — Progress Notes (Signed)
SUBJECTIVE:   CHIEF COMPLAINT / HPI: pap smear  Patient presenting for pap smear today, she is in a monogamous relationship and does not wish to have STI testing. Patient has not had a regular period for many months, she is not sure when her last period was or if she has had one in 2021. Her mother entered menopause around age 48, as did both of her sisters. She denies hot flashes, head aches, dysuria, discharge. She declines pregnancy test. Unsure of last date of intercourse, believes several weeks to months ago. No family h/o cancer of which she is aware.  Eye complaint: patient still having floater complaint. Referral to ophthalmology placed. Patient is having trouble finding an ophthalmologist who takes her insurance. Showed patient on her insurance card how to call to find ophthalmologists as well as showed her on the internet and how to navigate website to contact providers who take her insurance.  PERTINENT  PMH / PSH: borderline HLD  OBJECTIVE:   BP 130/64   Pulse 88   Ht 5' (1.524 m)   Wt 143 lb (64.9 kg)   LMP 07/05/2020   SpO2 99%   BMI 27.93 kg/m   Physical Exam Vitals and nursing note reviewed.  Constitutional:      General: She is not in acute distress.    Appearance: Normal appearance. She is normal weight. She is not ill-appearing, toxic-appearing or diaphoretic.  Cardiovascular:     Rate and Rhythm: Normal rate and regular rhythm.     Pulses: Normal pulses.     Heart sounds: Normal heart sounds.  Pulmonary:     Effort: Pulmonary effort is normal.     Breath sounds: Normal breath sounds.  Abdominal:     General: Abdomen is flat. Bowel sounds are normal.     Palpations: Abdomen is soft.  Genitourinary:    Comments: BREAST:  Symmetric breasts with no palpable breast masses or obvious breast lesions. She has no retractions or nipple discharge. She has no axillary abnormalities or palpable masses. 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 Normal external tissue with some hypopigmentation at the site of removal of the clitoris. Normal vaginal epithelium, no abnormal discharge. Normal appearing cervix.   Skin:    General: Skin is warm and dry.  Neurological:     General: No focal deficit present.     Mental Status: She is alert. Mental status is at baseline.  Psychiatric:        Mood and Affect: Mood normal.        Behavior: Behavior normal.    ASSESSMENT/PLAN:   Borderline hyperlipidemia HLD: patient reports that she previously had HLD, and was concerned because her father died of a MI in his 16s. Total cholesterol appropriate at 197, HDL appropriate at 57, Triglycerides appropriate at 148, LDL mildly elevated at 114. ASCVD <7.5%, no indication for statin at this time. Will continue to monitor annually.  Eye problem See notes above. Demonstrated patient how to find providers via her health insurance. List of providers who take her insurance printed and given to patient.  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 Area of hypopigmentation at scarring site from procedure. Patient confirms she underwent "circumcision." She reports no pain or difficulty in daily life, but finds pelvic exams with speculums painful.     Shirlean Mylar, MD [facility]

## 2020-07-15 NOTE — Assessment & Plan Note (Signed)
Pap smear completed today, negative HPV, NILM. Repeat in 5 years. Likely patient is perimenopausal if not fully in menopause. Discussed mammograms with patient. She opts to begin this screening at 49 yo.

## 2020-07-15 NOTE — Assessment & Plan Note (Addendum)
On exam patient had clitoris and labia minor removed. Area of hypopigmentation at scarring site from procedure. Patient confirms she underwent "circumcision." She reports no pain or difficulty in daily life, but finds pelvic exams with speculums painful.

## 2020-07-15 NOTE — Assessment & Plan Note (Signed)
See notes above. Demonstrated patient how to find providers via her health insurance. List of providers who take her insurance printed and given to patient.

## 2020-07-15 NOTE — Assessment & Plan Note (Addendum)
HLD: patient reports that she previously had HLD, and was concerned because her father died of a MI in his 20s. Total cholesterol appropriate at 197, HDL appropriate at 57, Triglycerides appropriate at 148, LDL mildly elevated at 114. ASCVD <7.5%, no indication for statin at this time. Will continue to monitor annually.

## 2020-09-06 ENCOUNTER — Other Ambulatory Visit: Payer: Self-pay

## 2020-09-06 ENCOUNTER — Ambulatory Visit (INDEPENDENT_AMBULATORY_CARE_PROVIDER_SITE_OTHER): Payer: 59 | Admitting: Family Medicine

## 2020-09-06 VITALS — BP 118/66 | HR 78 | Ht 65.0 in | Wt 143.4 lb

## 2020-09-06 DIAGNOSIS — Z23 Encounter for immunization: Secondary | ICD-10-CM

## 2020-09-06 DIAGNOSIS — I10 Essential (primary) hypertension: Secondary | ICD-10-CM | POA: Diagnosis not present

## 2020-09-06 NOTE — Assessment & Plan Note (Signed)
We spent significant time discussing the appropriate treatment of hypertension.  She was instructed not to change her blood pressure dosing based on whether or not she could "feel" her elevated blood pressure.  She was also informed that she did not need to check her blood pressure at home.  However, if she does check her blood pressure at home, appropriate instructions were provided for accurate blood pressure monitoring. -Reduce hydrochlorothiazide to prescribed dose of 12.5 mg

## 2020-09-06 NOTE — Progress Notes (Signed)
    SUBJECTIVE:   CHIEF COMPLAINT / HPI:   Blood pressure concerns She reports that she became increasingly concerned about her blood pressure earlier this week because she could "feel" that her blood pressure was elevated.  She could tell her blood pressure is high because she could feel stress in her legs and a mild headache.  She started taking 2 of her hydrochlorothiazide tablets for a total of 25 mg.  She is coming to clinic today to ensure that her blood pressure is now well controlled.  She specifically denies any lightheadedness or dizziness when moving from a seated to standing position.  PERTINENT  PMH / PSH: Essential hypertension  OBJECTIVE:   BP 118/66   Pulse 78   Ht 5\' 5"  (1.651 m)   Wt 143 lb 6 oz (65 kg)   SpO2 99%   BMI 23.86 kg/m    General: Alert and cooperative and appears to be in no acute distress HEENT: Neck non-tender without lymphadenopathy, masses or thyromegaly Cardio: Normal S1 and S2, no S3 or S4. Rhythm is regular. No murmurs or rubs.   Pulm: Clear to auscultation bilaterally, no crackles, wheezing, or diminished breath sounds. Normal respiratory effort Abdomen: Bowel sounds normal. Abdomen soft and non-tender.  Extremities: No peripheral edema. Warm/ well perfused.  Strong radial pulse. Neuro: Cranial nerves grossly intact  ASSESSMENT/PLAN:   Essential hypertension We spent significant time discussing the appropriate treatment of hypertension.  She was instructed not to change her blood pressure dosing based on whether or not she could "feel" her elevated blood pressure.  She was also informed that she did not need to check her blood pressure at home.  However, if she does check her blood pressure at home, appropriate instructions were provided for accurate blood pressure monitoring. -Reduce hydrochlorothiazide to prescribed dose of 12.5 mg   Health maintenance -Tetanus vaccine administered today.  , MD [facility]

## 2020-09-06 NOTE — Patient Instructions (Signed)
Blood pressure: I recommend that you return to the normal dose of your blood pressure medicine.  Please continue to take hydrochlorothiazide 12.5 mg daily.  You do not have to measure your blood pressure at home.  If you do, please follow the instructions I gave you in clinic: Sit down in a chair with your feet on the ground and your back against the chair.  Sit calmly for at least 2 minutes, breathing comfortably.  Measure your blood pressure from the top of your arm not on your wrist.

## 2021-03-10 ENCOUNTER — Other Ambulatory Visit: Payer: Self-pay

## 2021-03-10 ENCOUNTER — Ambulatory Visit (INDEPENDENT_AMBULATORY_CARE_PROVIDER_SITE_OTHER): Payer: 59 | Admitting: Family Medicine

## 2021-03-10 ENCOUNTER — Encounter: Payer: Self-pay | Admitting: Family Medicine

## 2021-03-10 VITALS — BP 126/80 | HR 77 | Ht 65.0 in | Wt 151.2 lb

## 2021-03-10 DIAGNOSIS — R7303 Prediabetes: Secondary | ICD-10-CM

## 2021-03-10 DIAGNOSIS — Z131 Encounter for screening for diabetes mellitus: Secondary | ICD-10-CM | POA: Diagnosis not present

## 2021-03-10 DIAGNOSIS — E785 Hyperlipidemia, unspecified: Secondary | ICD-10-CM | POA: Diagnosis not present

## 2021-03-10 DIAGNOSIS — H11431 Conjunctival hyperemia, right eye: Secondary | ICD-10-CM

## 2021-03-10 DIAGNOSIS — Z0000 Encounter for general adult medical examination without abnormal findings: Secondary | ICD-10-CM

## 2021-03-10 DIAGNOSIS — I10 Essential (primary) hypertension: Secondary | ICD-10-CM | POA: Diagnosis not present

## 2021-03-10 DIAGNOSIS — Z1211 Encounter for screening for malignant neoplasm of colon: Secondary | ICD-10-CM

## 2021-03-10 DIAGNOSIS — Z1231 Encounter for screening mammogram for malignant neoplasm of breast: Secondary | ICD-10-CM

## 2021-03-10 LAB — POCT GLYCOSYLATED HEMOGLOBIN (HGB A1C): HbA1c, POC (controlled diabetic range): 5.6 % (ref 0.0–7.0)

## 2021-03-10 NOTE — Patient Instructions (Addendum)
It was a pleasure to see you today!  1. We will get some labs today.  If they are abnormal or we need to do something about them, I will call you.  If they are normal, I will send you a message on MyChart (if it is active) or a letter in the mail.  If you don't hear from [place] in 2 weeks, please call the office  [phone].  2. For your eye: please follow up with your ophthalmologist on Friday, 4/8. I have called them and if they return my call tonight, I will call you and update you on any recommendations they have.   Be Well,  Dr. Leary Roca

## 2021-03-10 NOTE — Progress Notes (Signed)
    SUBJECTIVE:   CHIEF COMPLAINT / HPI: check up   HTN: Current meds are amlodipine 5 mg, HCTZ 12.5mg  qd. BP appropriate and at goal today, >130/80.  HLD: Patient had borderline HLD last year. Her father passed away at age 49 yo from MI. She has no cardiac history. Will check CMP and lipid panel today.  Healthcare maintenance: no fam h/o of colon cancer. Discussed mammogram and colonoscopy. Patient opts for mammogram and FIT test. Patient is interested in losing weight. She is practicing Ramadan this month and hopes that the fasting will help her reach her goal of 10 lb weight loss.  Right eye: patient reports that she had cataract surgery on 02/05/21 and 1 week later on 02/13/21 she started having some irritation in her right eye. She said it felt like an eyelash or grain of sand was in her eye and irritating it on the lateral side, but she could not locate any item in her eye. Today she has a little bit of preseptal swelling as well as conjunctival injection in the right eye only. She has an upcoming appt with her ophthalmologist on 4/8. She ran out of the ketorolac eye drops given to her 2 days ago.  PERTINENT  PMH / PSH: cataract surgery, HLD, HTN  OBJECTIVE:   BP 126/80   Pulse 77   Ht 5\' 5"  (1.651 m)   Wt 151 lb 3.2 oz (68.6 kg)   SpO2 96%   BMI 25.16 kg/m   Nursing note and vitals reviewed GEN:  resting comfortably in chair, NAD, WNWD HEENT: NCAT. PERRLA. Sclera with conjunctival and tarsal injection of R eye, anicteric, clear cornea, EOMI. MMM.  Cardiac: Regular rate and rhythm. Normal S1/S2. No murmurs, rubs, or gallops appreciated. 2+ radial pulses. Lungs: Clear bilaterally to ascultation. No increased WOB, no accessory muscle usage. No w/r/r. Neuro: Alert and at baseline Ext: no edema Psych: Pleasant and appropriate  ASSESSMENT/PLAN:   Essential hypertension Patient is at goal of <130/80 today. No change in current regimen: amlodipine 5 mg, HCTZ 12.5mg  qd. Will obtain  CMP.  Borderline hyperlipidemia Will obtain CMP, LDL direct to assess HLD. Borderline elevated last year.  Conjunctival injection, right Patient had recent eye surgery on 3/2 and subsequently started having eye pain on 3/10, feels like irritant. No e/o corneal abrasion, no discharge, no e/o foreign body, no change in vision, but photophobia present. Discussed with preceptor, called patient's ophthalmologist (Dr. 5/10 at [facility]), awaiting call back. Patient has appt with opth on 4/8. Will await further recommendations  Healthcare maintenance A1c 5.6% today. Patient is fasting this month with Ramadan, making dietary changes, walking more. Patient opts for mammogram (ordered) and FIT test (ordered).     03-11-1968, MD [facility]

## 2021-03-10 NOTE — Assessment & Plan Note (Signed)
A1c 5.6% today. Patient is fasting this month with Ramadan, making dietary changes, walking more. Patient opts for mammogram (ordered) and FIT test (ordered).

## 2021-03-10 NOTE — Assessment & Plan Note (Signed)
Will obtain CMP, LDL direct to assess HLD. Borderline elevated last year.

## 2021-03-10 NOTE — Assessment & Plan Note (Addendum)
Patient had recent eye surgery on 3/2 and subsequently started having eye pain on 3/10, feels like irritant. No e/o corneal abrasion, no discharge, no e/o foreign body, no change in vision, but photophobia present. Discussed with preceptor, called patient's ophthalmologist (Dr. Lehman Prom at [facility]), awaiting call back. Patient has appt with opth on 4/8. Will await further recommendations

## 2021-03-10 NOTE — Assessment & Plan Note (Addendum)
Patient is at goal of <130/80 today. No change in current regimen: amlodipine 5 mg, HCTZ 12.5mg  qd. Will obtain CMP.

## 2021-03-11 LAB — COMPREHENSIVE METABOLIC PANEL
ALT: 14 IU/L (ref 0–32)
AST: 18 IU/L (ref 0–40)
Albumin/Globulin Ratio: 1.4 (ref 1.2–2.2)
Albumin: 4 g/dL (ref 3.8–4.8)
Alkaline Phosphatase: 74 IU/L (ref 44–121)
BUN/Creatinine Ratio: 23 (ref 9–23)
BUN: 15 mg/dL (ref 6–24)
Bilirubin Total: 0.3 mg/dL (ref 0.0–1.2)
CO2: 26 mmol/L (ref 20–29)
Calcium: 9.6 mg/dL (ref 8.7–10.2)
Chloride: 102 mmol/L (ref 96–106)
Creatinine, Ser: 0.64 mg/dL (ref 0.57–1.00)
Globulin, Total: 2.9 g/dL (ref 1.5–4.5)
Glucose: 91 mg/dL (ref 65–99)
Potassium: 3.5 mmol/L (ref 3.5–5.2)
Sodium: 143 mmol/L (ref 134–144)
Total Protein: 6.9 g/dL (ref 6.0–8.5)
eGFR: 108 mL/min/{1.73_m2} (ref >59)

## 2021-03-11 LAB — LDL CHOLESTEROL, DIRECT: LDL Direct: 140 mg/dL — ABNORMAL HIGH (ref 0–99)

## 2021-04-24 ENCOUNTER — Telehealth: Payer: Self-pay | Admitting: Family Medicine

## 2021-04-24 NOTE — Telephone Encounter (Signed)
Called patient to follow up if she had gotten into see her ophthalmologist. Left HIPAA compliant VM. If patient returns call and I am on vacation, her blood work looked good, I still recommend exercise like walking and eating fruits and vegetables. Mostly I want to make sure she was able to see her ophthalmologist again for her eye complaint.  Shirlean Mylar, MD [facility], PGY-2

## 2021-05-07 ENCOUNTER — Telehealth: Payer: Self-pay

## 2021-05-07 ENCOUNTER — Other Ambulatory Visit: Payer: Self-pay

## 2021-05-07 NOTE — Telephone Encounter (Signed)
Sent request to PCP and covering PCP. Aquilla Solian, CMA

## 2021-05-07 NOTE — Telephone Encounter (Signed)
Pt stopped into the office requesting Dr. Leary Roca call in her refills for amlodipine 5 mg, HCTZ 12.5mg  qd. BP

## 2021-05-08 MED ORDER — AMLODIPINE BESYLATE 5 MG PO TABS: 5.0000 mg | ORAL_TABLET | Freq: Every day | ORAL | 3 refills | Status: DC

## 2021-05-12 ENCOUNTER — Other Ambulatory Visit: Payer: Self-pay

## 2021-05-12 MED ORDER — HYDROCHLOROTHIAZIDE 12.5 MG PO TABS: 12.5000 mg | ORAL_TABLET | Freq: Every day | ORAL | 3 refills | Status: DC

## 2021-06-25 ENCOUNTER — Ambulatory Visit
Admission: RE | Admit: 2021-06-25 | Discharge: 2021-06-25 | Disposition: A | Discharge status: Home / Self Care | Payer: 59 | Source: Ambulatory Visit | Attending: Family Medicine | Admitting: Family Medicine

## 2021-06-25 ENCOUNTER — Other Ambulatory Visit: Payer: Self-pay

## 2021-06-25 DIAGNOSIS — Z1231 Encounter for screening mammogram for malignant neoplasm of breast: Secondary | ICD-10-CM

## 2021-06-27 ENCOUNTER — Other Ambulatory Visit: Payer: Self-pay | Admitting: Family Medicine

## 2021-06-27 DIAGNOSIS — R928 Other abnormal and inconclusive findings on diagnostic imaging of breast: Secondary | ICD-10-CM

## 2021-08-21 ENCOUNTER — Ambulatory Visit
Admission: RE | Admit: 2021-08-21 | Discharge: 2021-08-21 | Disposition: A | Discharge status: Home / Self Care | Payer: 59 | Source: Ambulatory Visit | Attending: Family Medicine | Admitting: Family Medicine

## 2021-08-21 ENCOUNTER — Other Ambulatory Visit: Payer: Self-pay

## 2021-08-21 ENCOUNTER — Other Ambulatory Visit: Payer: Self-pay | Admitting: Family Medicine

## 2021-08-21 DIAGNOSIS — R928 Other abnormal and inconclusive findings on diagnostic imaging of breast: Secondary | ICD-10-CM

## 2021-08-21 DIAGNOSIS — N6320 Unspecified lump in the left breast, unspecified quadrant: Secondary | ICD-10-CM

## 2021-10-16 ENCOUNTER — Ambulatory Visit: Payer: 59 | Admitting: Family Medicine

## 2021-10-28 ENCOUNTER — Ambulatory Visit: Payer: 59 | Admitting: Family Medicine

## 2021-11-26 ENCOUNTER — Emergency Department (HOSPITAL_COMMUNITY): Payer: 59

## 2021-11-26 ENCOUNTER — Emergency Department (HOSPITAL_COMMUNITY)
Admission: EM | Admit: 2021-11-26 | Discharge: 2021-11-26 | Disposition: A | Discharge status: Home / Self Care | Payer: 59 | Attending: Emergency Medicine | Admitting: Emergency Medicine

## 2021-11-26 ENCOUNTER — Other Ambulatory Visit: Payer: Self-pay

## 2021-11-26 DIAGNOSIS — Z7982 Long term (current) use of aspirin: Secondary | ICD-10-CM | POA: Diagnosis not present

## 2021-11-26 DIAGNOSIS — U071 COVID-19: Secondary | ICD-10-CM

## 2021-11-26 DIAGNOSIS — Z79899 Other long term (current) drug therapy: Secondary | ICD-10-CM | POA: Diagnosis not present

## 2021-11-26 DIAGNOSIS — R509 Fever, unspecified: Secondary | ICD-10-CM | POA: Diagnosis present

## 2021-11-26 DIAGNOSIS — I10 Essential (primary) hypertension: Secondary | ICD-10-CM | POA: Diagnosis not present

## 2021-11-26 LAB — CBC WITH DIFFERENTIAL/PLATELET
Abs Immature Granulocytes: 0.02 10*3/uL (ref 0.00–0.07)
Basophils Absolute: 0 10*3/uL (ref 0.0–0.1)
Basophils Relative: 0 %
Eosinophils Absolute: 0 10*3/uL (ref 0.0–0.5)
Eosinophils Relative: 0 %
HCT: 41.3 % (ref 36.0–46.0)
Hemoglobin: 13.7 g/dL (ref 12.0–15.0)
Immature Granulocytes: 0 %
Lymphocytes Relative: 23 %
Lymphs Abs: 1.5 10*3/uL (ref 0.7–4.0)
MCH: 28 pg (ref 26.0–34.0)
MCHC: 33.2 g/dL (ref 30.0–36.0)
MCV: 84.5 fL (ref 80.0–100.0)
Monocytes Absolute: 0.7 10*3/uL (ref 0.1–1.0)
Monocytes Relative: 10 %
Neutro Abs: 4.5 10*3/uL (ref 1.7–7.7)
Neutrophils Relative %: 67 %
Platelets: 186 10*3/uL (ref 150–400)
RBC: 4.89 MIL/uL (ref 3.87–5.11)
RDW: 13 % (ref 11.5–15.5)
WBC: 6.8 10*3/uL (ref 4.0–10.5)
nRBC: 0 % (ref 0.0–0.2)

## 2021-11-26 LAB — BASIC METABOLIC PANEL
Anion gap: 8 (ref 5–15)
BUN: 15 mg/dL (ref 6–20)
CO2: 31 mmol/L (ref 22–32)
Calcium: 9.1 mg/dL (ref 8.9–10.3)
Chloride: 97 mmol/L — ABNORMAL LOW (ref 98–111)
Creatinine, Ser: 0.82 mg/dL (ref 0.44–1.00)
GFR, Estimated: >60 mL/min (ref >60)
Glucose, Bld: 151 mg/dL — ABNORMAL HIGH (ref 70–99)
Potassium: 3.5 mmol/L (ref 3.5–5.1)
Sodium: 136 mmol/L (ref 135–145)

## 2021-11-26 LAB — RESP PANEL BY RT-PCR (FLU A&B, COVID) ARPGX2
Influenza A by PCR: NEGATIVE
Influenza B by PCR: NEGATIVE
SARS Coronavirus 2 by RT PCR: POSITIVE — AB

## 2021-11-26 MED ORDER — NIRMATRELVIR/RITONAVIR (PAXLOVID)TABLET: 3.0000 | ORAL_TABLET | Freq: Two times a day (BID) | ORAL | 0 refills | Status: AC

## 2021-11-26 MED ORDER — IBUPROFEN 200 MG PO TABS
600.0000 mg | ORAL_TABLET | Freq: Once | ORAL | Status: AC
  Administered 2021-11-26: 12:00:00 600 mg via ORAL
  Filled 2021-11-26: qty 3

## 2021-11-26 MED ORDER — ACETAMINOPHEN 325 MG PO TABS: 650.0000 mg | ORAL_TABLET | Freq: Once | ORAL | Status: DC | PRN

## 2021-11-26 NOTE — Discharge Instructions (Addendum)
Return to ED with any new or worsening symptoms such as shortness of breath Fill prescription medication at local pharmacy as desired Take tylenol for fevers Take ibuprofen for body aches Follow up with PCP in 7 days if symptoms persist

## 2021-11-26 NOTE — ED Provider Notes (Signed)
Emergency Medicine Provider Triage Evaluation Note  Jessica Andrade , a 50 y.o. female  was evaluated in triage.  Pt complains of ill since 3 days ago with fever, cough and dizziness.  She has had COVID vaccines but not influenza vaccine.  She denies focal weakness or paresthesia.  Review of Systems  Positive: Cough, fever, dizziness Negative: Weakness, paresthesia  Physical Exam  BP (!) 155/80 (BP Location: Right Arm)    Pulse 98    Temp (!) 101.8 F (38.8 C) (Oral)    Resp 18    Ht 4' 11.84" (1.52 m)    Wt 65.8 kg    LMP 07/05/2020    SpO2 96%    BMI 28.47 kg/m  Gen:   Awake, no distress   Resp:  Normal effort MSK:   Moves extremities without difficulty Other:  Nontoxic appearance  Medical Decision Making  Medically screening exam initiated at 11:36 AM.  Appropriate orders placed.  Jessica Andrade was informed that the remainder of the evaluation will be completed by another provider, this initial triage assessment does not replace that evaluation, and the importance of remaining in the ED until their evaluation is complete.  Likely viral process   Mancel Bale, MD 11/26/21 1137

## 2021-11-26 NOTE — ED Triage Notes (Signed)
Patient presents with complaints of feeling tried, coughing and fever.  She has suffered with these symptoms since Monday.

## 2021-11-26 NOTE — ED Provider Notes (Addendum)
[facility] DEPT Provider Note   CSN: [id number] Arrival date & time: 11/26/21  1056     History Chief Complaint  Patient presents with   Fever   Fatigue   Cough    Jessica Andrade is a 50 y.o. female with history of high blood pressure.  Patient states that 3 days ago she began feeling ill, complaining of fever, dizziness, cough.  Patient states that she has been vaccinated against COVID-19 and the flu this year.  Patient denies any known sick contacts.  Patient denies sore throat, shortness of breath, chest pain, nausea, vomiting, diarrhea.   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     Family History  Problem Relation Age of Onset   Hypertension Father    Heart attack Father    Healthy Mother     Social History   Tobacco Use   Smoking status: Never   Smokeless tobacco: Never  Vaping Use   Vaping Use: Never used  Substance Use Topics   Alcohol use: No   Drug use: No    Home Medications Prior to  Admission medications   Medication Sig Start Date End Date Taking? Authorizing Provider  nirmatrelvir/ritonavir EUA (PAXLOVID) 20 x 150 MG & 10 x 100MG  TABS Take 3 tablets by mouth 2 (two) times daily for 5 days. Patient GFR is >60. Take nirmatrelvir (150 mg) two tablets twice daily for 5 days and ritonavir (100 mg) one tablet twice daily for 5 days. 11/26/21 12/01/21 Yes Azucena Cecil, PA-C  amLODipine (NORVASC) 5 MG tablet Take 1 tablet (5 mg total) by mouth daily. 05/08/21   Autry-Lott, Naaman Plummer, DO  aspirin 81 MG tablet Take 81 mg by mouth daily.    [provider]  hydrochlorothiazide (HYDRODIURIL) 12.5 MG tablet Take 1 tablet (12.5 mg total) by mouth daily. 05/12/21   Gladys Damme, MD  predniSONE (DELTASONE) 50 MG tablet One daily with food 02/17/20   Robyn Haber, MD  atenolol-chlorthalidone (TENORETIC) 50-25 MG tablet Take 1 tablet by mouth daily. Patient not taking: Reported on 02/17/2020 04/23/16 02/17/20  Mayo, Pete Pelt, MD    Allergies    Lisinopril  Review of Systems   Review of Systems  Constitutional:  Positive for fever.  HENT:  Negative for sore throat.   Respiratory:  Positive for cough. Negative for shortness of breath.   Cardiovascular:  Negative for chest pain.  Gastrointestinal:  Negative for abdominal pain, diarrhea, nausea and vomiting.  Neurological:  Positive for  dizziness.  All other systems reviewed and are negative.  Physical Exam Updated Vital Signs BP (!) 155/80 (BP Location: Right Arm)    Pulse 98    Temp (!) 101.8 F (38.8 C) (Oral)    Resp 18    Ht 4' 11.84" (1.52 m)    Wt 65.8 kg    LMP 07/05/2020    SpO2 96%    BMI 28.47 kg/m   Physical Exam Vitals and nursing note reviewed.  Constitutional:      General: She is not in acute distress.    Appearance: She is not ill-appearing or toxic-appearing.  HENT:     Head: Normocephalic.     Nose: Nose normal.     Mouth/Throat:     Mouth: Mucous membranes are moist.  Eyes:     Pupils:  Pupils are equal, round, and reactive to light.  Cardiovascular:     Rate and Rhythm: Normal rate and regular rhythm.     Pulses: Normal pulses.  Pulmonary:     Effort: Pulmonary effort is normal.     Breath sounds: Normal breath sounds. No stridor. No wheezing.  Abdominal:     General: Abdomen is flat.     Palpations: Abdomen is soft.     Tenderness: There is no abdominal tenderness.  Musculoskeletal:     Cervical back: Normal range of motion.  Skin:    General: Skin is warm and dry.     Capillary Refill: Capillary refill takes less than 2 seconds.  Neurological:     Mental Status: She is alert. Mental status is at baseline.  Psychiatric:        Mood and Affect: Mood normal.    ED Results / Procedures / Treatments   Labs (all labs ordered are listed, but only abnormal results are displayed) Labs Reviewed  RESP PANEL BY RT-PCR (FLU A&B, COVID) ARPGX2 - Abnormal; Notable for the following components:      Result Value   SARS Coronavirus 2 by RT PCR POSITIVE (*)    All other components within normal limits  BASIC METABOLIC PANEL - Abnormal; Notable for the following components:   Chloride 97 (*)    Glucose, Bld 151 (*)    All other components within normal limits  CBC WITH DIFFERENTIAL/PLATELET    EKG None  Radiology DG Chest 2 View  Result Date: 11/26/2021 CLINICAL DATA:  Provided history: Cough. Additional history: Patient reports feeling tired, cough, fever. EXAM: CHEST - 2 VIEW COMPARISON:  Prior chest radiographs 04/11/2007 and earlier. FINDINGS: Heart size within normal limits. No appreciable air space consolidation. No evidence of pleural effusion or pneumothorax. No acute bony abnormality identified. IMPRESSION: No evidence of acute cardiopulmonary abnormality. Electronically Signed   By: Jackey Loge D.O.   On: 11/26/2021 12:43    Procedures Procedures   Medications Ordered in ED Medications  ibuprofen (ADVIL) tablet 600 mg (600 mg Oral Given by Other  11/26/21 1144)    ED Course  I have reviewed the triage vital signs and the nursing notes.  Pertinent labs & imaging results that were available during my care of the patient were reviewed by me and considered in my medical decision making (see chart for details).    MDM Rules/Calculators/A&P                          50 year old female with history of hypertension.  Patient states for the last 3 days she has had respiratory  infection-like symptoms.  We assessed this patient utilizing respiratory panel, chest x-ray, CBC, BMP.  CBC results are within normal limits BMP results show no significant abnormalities that are not in line with patient baseline. Patient chest x-ray shows no evidence of cardiopulmonary disease or pneumonia Patient respiratory panel positive for COVID 19  At present time of examination this patient is not hypoxic, febrile at 101.8 Fahrenheit, hypertensive at 155/80 with existing history of hypertension.  Patient states that she has not taken her antihypertensive medication today.  Patient has clear lung sounds bilaterally.    I will prescribe this patient Paxlovid.  This patient's kidney function has a GFR greater than 60.  I have discussed the results of her respiratory panel and she expresses understanding with her diagnosis.  I have instructed this patient to isolate herself per CDC protocol.  I have given this patient return precautions to include shortness of breath which she verbalizes understanding with.  I have instructed this patient to follow-up with her PCP in the next 7 days if her symptoms are persisting even with antiviral medication.  This patient expresses understanding with all instructions given.  This patient is stable on discharge.    Final Clinical Impression(s) / ED Diagnoses Final diagnoses:  COVID    Rx / DC Orders ED Discharge Orders          Ordered    nirmatrelvir/ritonavir EUA (PAXLOVID) 20 x 150 MG & 10 x 100MG  TABS  2 times daily         11/26/21 1330                 Azucena Cecil, [place] 11/26/21 1338    Charlesetta Shanks, MD 12/02/21 [id number]

## 2021-12-17 ENCOUNTER — Ambulatory Visit (INDEPENDENT_AMBULATORY_CARE_PROVIDER_SITE_OTHER): Payer: Self-pay | Admitting: Family Medicine

## 2021-12-17 ENCOUNTER — Other Ambulatory Visit: Payer: Self-pay

## 2021-12-17 ENCOUNTER — Encounter: Payer: Self-pay | Admitting: Family Medicine

## 2021-12-17 VITALS — BP 123/65 | HR 102 | Ht 60.0 in | Wt 154.0 lb

## 2021-12-17 DIAGNOSIS — M1812 Unilateral primary osteoarthritis of first carpometacarpal joint, left hand: Secondary | ICD-10-CM

## 2021-12-17 DIAGNOSIS — M189 Osteoarthritis of first carpometacarpal joint, unspecified: Secondary | ICD-10-CM

## 2021-12-17 DIAGNOSIS — N6342 Unspecified lump in left breast, subareolar: Secondary | ICD-10-CM

## 2021-12-17 DIAGNOSIS — M7502 Adhesive capsulitis of left shoulder: Secondary | ICD-10-CM

## 2021-12-17 DIAGNOSIS — I10 Essential (primary) hypertension: Secondary | ICD-10-CM

## 2021-12-17 DIAGNOSIS — J069 Acute upper respiratory infection, unspecified: Secondary | ICD-10-CM

## 2021-12-17 MED ORDER — MELOXICAM 7.5 MG PO TABS: 7.5000 mg | ORAL_TABLET | Freq: Every day | ORAL | 0 refills | Status: DC

## 2021-12-17 NOTE — Progress Notes (Signed)
SUBJECTIVE:   CHIEF COMPLAINT / HPI:   HTN: Current regimen includes amlodipine 10 mg, HCTZ 12.5mg . BP at goal: 123/65.  Left wrist pain: Patient has noticed pain in her left wrist along the base of her thumb. Onset was insidious, but it has been a couple of months at least. Her pain is most noticeable as she does her prayers (patient is a Public relations account executive muslim). When she puts weight on her outspread hand on her prayer rug is when her pain is at its worst. She also notices it when she presses on her left thumb base, or turns caps/lids to open. She has not tried any medication for her pain.  Left shoulder pain: Similarly insidious onset with pain for a few months. Patient notices the pain when she lifts her arms over her head, her pain doesn't start until about 90*. She can lift her arm above her head, but it is painful. She has not tried any medication for it yet.  L breast: mammogram found a hypoechoic mass on the left breast. She had diagnostic US in 9/22 that showed masses that are likely benign, radiology recommendation to repeat diagnostic US in 6 months (March 2023), reminded her of this today.  PERTINENT  PMH / PSH: L breast mass, hypertension  OBJECTIVE:   BP 123/65    Pulse (!) 102    Ht 5' (1.524 m)    Wt 154 lb (69.9 kg)    LMP 07/05/2020    SpO2 99%    BMI 30.08 kg/m   Nursing note and vitals reviewed GEN: age-appropriate, African woman, resting comfortably in chair, NAD, WNWD HEENT: NCAT. PERRLA. Sclera without injection or icterus. MMM.  Neck: Supple.  Cardiac: Regular rate and rhythm. Normal S1/S2. No murmurs, rubs, or gallops appreciated. 2+ radial pulses. Left wrist: Inspection yielded no erythema, ecchymosis, bony deformity, + mild swelling over 1st CMC. ROM full with good flexion and extension and ulnar/radial deviation that is symmetrical with opposite wrist. Palpation is + mild pain over CMC, normal over metacarpals, scaphoid, lunate. Strength 5/5 in all directions with  mild pain. Negative Finkelstein, tinel's and phalens. L shoulder: No evidence of bony deformity, asymmetry, or muscle atrophy; No tenderness over long head of biceps (bicipital groove). No TTP at [facility] joint. Full active and passive range of motion (180 flex Elisabeth Most /150Abd /90ER /70IR), but painful arc + 90-110*. Thumb to T12 without significant tenderness. Strength 5/5 throughout. No abnormal scapular function observed. Sensation intact. Neer, Hawkin's, and empty can testing negative. Neuro: AOx3  Ext: no edema Psych: Pleasant and appropriate  ASSESSMENT/PLAN:   Essential hypertension Chronic, stable, well controlled. Continue current regimen.  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 Discussed options for treatment, patient has not tried any medication. She does not yet want to do formal PT, HEP for ROM given. Also recommend mobic for pain/inflammation, she can use topical gel like voltaren as well. F/u 4-6 weeks if no improvement, will recommend referral to [facility] and/or formal PT at that time.  Osteoarthritis of first carpometacarpal [facility]) joint of one hand As patient has negative finklestein test and has swelling at first [facility], suspect OA. Patient has not yet tried any medications. Recommend mobic, gentle ROM, topical voltaren gel. F/u 4-6 weeks, if no improvement, recommend [facility] for [place]. Patient may benefit from steroid injection if no improvement.  Left breast  mass Reminded patient to schedule repeat breast US in March 2023.     Shirlean Mylar, MD [facility]

## 2021-12-17 NOTE — Patient Instructions (Addendum)
It was a pleasure to see you today!  For your shoulder: you likely have something called frozen shoulder. Start by doing gentle exercises (see attached pictures). If no improvement in 4-6 weeks, I recommend you follow up with sports medicine. Call our office and we can send in a referral. For your thumb: you likely have arthritis. Take meloxicam 7.5mg  once a day for 14 days. If no improvement I recommend you go to sports medicine for follow up. Call our office and we will send a referral. Reminder to get your next breast ultrasound in March 2023.   Be Well,  Dr. Leary Roca

## 2021-12-21 DIAGNOSIS — M7500 Adhesive capsulitis of unspecified shoulder: Secondary | ICD-10-CM | POA: Insufficient documentation

## 2021-12-21 DIAGNOSIS — M189 Osteoarthritis of first carpometacarpal joint, unspecified: Secondary | ICD-10-CM | POA: Insufficient documentation

## 2021-12-21 NOTE — Assessment & Plan Note (Signed)
Chronic, stable, well controlled. Continue current regimen.

## 2021-12-21 NOTE — Assessment & Plan Note (Signed)
Suspect that patient likely has frozen shoulder syndrome due to demographics, history and physical exam, with no sign of impingement, but classic painful arc positive for frozen shoulder. Discussed options for treatment, patient has not tried any medication. She does not yet want to do formal PT, HEP for ROM given. Also recommend mobic for pain/inflammation, she can use topical gel like voltaren as well. F/u 4-6 weeks if no improvement, will recommend referral to [facility] and/or formal PT at that time.

## 2021-12-22 DIAGNOSIS — N6320 Unspecified lump in the left breast, unspecified quadrant: Secondary | ICD-10-CM | POA: Insufficient documentation

## 2021-12-22 NOTE — Assessment & Plan Note (Signed)
As patient has negative finklestein test and has swelling at first [facility], suspect OA. Patient has not yet tried any medications. Recommend mobic, gentle ROM, topical voltaren gel. F/u 4-6 weeks, if no improvement, recommend [facility] for [place]. Patient may benefit from steroid injection if no improvement.

## 2021-12-22 NOTE — Assessment & Plan Note (Signed)
Reminded patient to schedule repeat breast US in March 2023.

## 2022-01-05 ENCOUNTER — Telehealth: Payer: Self-pay

## 2022-01-05 NOTE — Telephone Encounter (Signed)
Patient calls nurse line reporting she has been taking Meloxicam daily, however with minimal relief. Patient reports she is still in a great deal of shoulder/neck pain. Patient reports she read the bottle and "warned" not to take any OTC medications like Tylenol or Ibuprofen while taking Meloxicam. Patient would like PCP recommendations on additional pain relief.   Patient advised per last OV she should make an apt if no improvement. Patient declined and stated, "all I need is advice, I am in so much pain." Patient advised if in severe pain UC would be a good option, however patient declined due to insurance.   Red flags discussed with patient. Will forward to PCP for advisement.

## 2022-01-07 NOTE — Telephone Encounter (Signed)
Spoke with patient. Made appt for 2/10 for neck pain. Aquilla Solian, CMA

## 2022-01-16 ENCOUNTER — Ambulatory Visit (INDEPENDENT_AMBULATORY_CARE_PROVIDER_SITE_OTHER): Payer: Self-pay | Admitting: Family Medicine

## 2022-01-16 ENCOUNTER — Encounter: Payer: Self-pay | Admitting: Family Medicine

## 2022-01-16 ENCOUNTER — Other Ambulatory Visit: Payer: Self-pay

## 2022-01-16 VITALS — BP 134/78 | HR 87 | Wt 150.0 lb

## 2022-01-16 DIAGNOSIS — N6342 Unspecified lump in left breast, subareolar: Secondary | ICD-10-CM

## 2022-01-16 DIAGNOSIS — M75102 Unspecified rotator cuff tear or rupture of left shoulder, not specified as traumatic: Secondary | ICD-10-CM

## 2022-01-16 DIAGNOSIS — M189 Osteoarthritis of first carpometacarpal joint, unspecified: Secondary | ICD-10-CM

## 2022-01-16 NOTE — Assessment & Plan Note (Signed)
Patient has appropriate 6 month [place] follow up next month

## 2022-01-16 NOTE — Patient Instructions (Addendum)
It was a pleasure to see you today!  I have placed a referral for sports medicine. You should receive a phone call in 1-2 weeks to schedule this appointment. If for any reason you do not receive a phone call or need more help scheduling this appointment, please call our office at [phone]. Continue to use the topical pain relief on your thumb and shoulder as needed I will get x-rays of your left thumb. Please go to [facility] between 8:30 AM and 4:30 PM M-F to get the imaging done.  Be Well,  Dr. Leary Roca

## 2022-01-16 NOTE — Progress Notes (Signed)
° ° °  SUBJECTIVE:   CHIEF COMPLAINT / HPI:   L Shoulder pain: patient reports improvement in pain of left should with home ROM exercises. She notes that meloxicam did not help her pain. She has tried icyhot with some success, also reports OTC analgesia helps with pain, but pain returns in 8 hours or so.   L thumb pain: Patient continues to have mild swelling and pain with usage (especially when doing her daily Islamic prayers). Similarly, she has some improvement with OTC analgesia, but pain returns after 6-8 hours. She notes that she does not want corticosteroid injections.  L breast mass: patient has 6 month repeat breast ultrasound scheduled for February 19, 2022.   HC maintenance: patient does not wish to consider colonoscopy at this time, will keep in mind for future. Declined zoster immunization today.  PERTINENT  PMH / PSH: OA of left first CMC  OBJECTIVE:   BP 134/78    Pulse 87    Wt 150 lb (68 kg)    LMP 07/05/2020    BMI 29.29 kg/m   Nursing note and vitals reviewed GEN: age-appropriate, African Woman, resting comfortably in chair, NAD, WNWD Left shoulder: No evidence of bony deformity, asymmetry, or muscle atrophy; No tenderness over long head of biceps (bicipital groove). No TTP at [facility] joint. Full active and passive range of motion (180 flex Elisabeth Most /150Abd /90ER /70IR); painful arc present 100-120*; Strength 5/5 throughout. Sensation intact. Peripheral pulses intact. Negative Hawkins, empty can, Spurling tests. Left hand: Inspection yielded no erythema, ecchymosis, bony deformity; + mild swelling. ROM full with good flexion and extension and ulnar/radial deviation that is symmetrical with opposite wrist. Palpation is normal over metacarpals, scaphoid, lunate, and TFCC; Strength 5/5 in all directions without pain. + Grind test. No TTP over anatomic snuffbox. Negative Finkelstein, tinel's and phalens. Neuro: AOx3  Ext: no edema Psych: Pleasant and appropriate   ASSESSMENT/PLAN:    Osteoarthritis of first carpometacarpal [facility]) joint of one hand Patient has recurrent pain in left thumb that is minimally responsive to OTC analgesia; most likely ddx is OA. This significantly impacts her functioning to perform prayers and routine activities (cleaning, opening jars, gripping items, etc). After shared decision making, pt to continue OTC analgesia and topical voltaren gel. Will obtain Left thumb x-ray, refer to sports medicine after shared decision making. Patient prefers not to have CSI. ROM, sensation, and pulses are intact.  Rotator cuff syndrome of left shoulder Mild, improvement with ROM exercises, responsive to OTC analgesia. Recommend topical voltaren gel as well. As patient has fully intact ROM today and improvement in pain, recommend continue ROM. Suspect rotator cuff involvement given painful arc present (100-120* today but able to fully complete actively vs at last appt only able to complete passively). Patient can continue exercises. As she has improved, will not obtain imaging at this time.  Left breast mass Patient has appropriate 6 month [place] follow up next month     Shirlean Mylar, MD [facility]

## 2022-01-16 NOTE — Assessment & Plan Note (Signed)
Mild, improvement with ROM exercises, responsive to OTC analgesia. Recommend topical voltaren gel as well. As patient has fully intact ROM today and improvement in pain, recommend continue ROM. Suspect rotator cuff involvement given painful arc present (100-120* today but able to fully complete actively vs at last appt only able to complete passively). Patient can continue exercises. As she has improved, will not obtain imaging at this time.

## 2022-01-16 NOTE — Assessment & Plan Note (Addendum)
Patient has recurrent pain in left thumb that is minimally responsive to OTC analgesia; most likely ddx is OA. This significantly impacts her functioning to perform prayers and routine activities (cleaning, opening jars, gripping items, etc). After shared decision making, pt to continue OTC analgesia and topical voltaren gel. Will obtain Left thumb x-ray, refer to sports medicine after shared decision making. Patient prefers not to have CSI. ROM, sensation, and pulses are intact.

## 2022-02-19 ENCOUNTER — Other Ambulatory Visit: Payer: Self-pay | Admitting: Family Medicine

## 2022-02-19 ENCOUNTER — Ambulatory Visit
Admission: RE | Admit: 2022-02-19 | Discharge: 2022-02-19 | Disposition: A | Discharge status: Home / Self Care | Payer: Managed Care, Other (non HMO) | Source: Ambulatory Visit | Attending: Family Medicine | Admitting: Family Medicine

## 2022-05-26 ENCOUNTER — Other Ambulatory Visit: Payer: Self-pay | Admitting: Family Medicine

## 2022-06-22 ENCOUNTER — Ambulatory Visit (INDEPENDENT_AMBULATORY_CARE_PROVIDER_SITE_OTHER): Payer: Commercial Managed Care - HMO

## 2022-06-22 ENCOUNTER — Other Ambulatory Visit: Payer: Self-pay

## 2022-06-22 ENCOUNTER — Ambulatory Visit (HOSPITAL_COMMUNITY)
Admission: EM | Admit: 2022-06-22 | Discharge: 2022-06-22 | Disposition: A | Discharge status: Home / Self Care | Payer: Commercial Managed Care - HMO | Attending: Internal Medicine | Admitting: Internal Medicine

## 2022-06-22 ENCOUNTER — Encounter (HOSPITAL_COMMUNITY): Payer: Self-pay | Admitting: Emergency Medicine

## 2022-06-22 DIAGNOSIS — M25562 Pain in left knee: Secondary | ICD-10-CM

## 2022-06-22 MED ORDER — IBUPROFEN 800 MG PO TABS
ORAL_TABLET | ORAL | Status: AC
  Filled 2022-06-22: qty 1

## 2022-06-22 MED ORDER — IBUPROFEN 600 MG PO TABS: 600.0000 mg | ORAL_TABLET | Freq: Four times a day (QID) | ORAL | 0 refills | Status: DC | PRN

## 2022-06-22 MED ORDER — IBUPROFEN 800 MG PO TABS
800.0000 mg | ORAL_TABLET | Freq: Once | ORAL | Status: AC
  Administered 2022-06-22: 800 mg via ORAL

## 2022-06-22 NOTE — Discharge Instructions (Addendum)
Your knee x-ray showed some mild arthritis to the left knee.  We gave you ibuprofen in the clinic for your knee pain. You may continue to take ibuprofen every 6 hours as needed at home for your pain.  Do not take any ibuprofen until tomorrow morning since we gave you some in the clinic.  Take this medicine with food to avoid stomach upset.  Call the orthopedic doctor listed on your paperwork for follow-up appointment regarding your knee pain.  Continue wearing the knee brace to decrease pain and inflammation.  If you develop any new or worsening symptoms or do not improve in the next 2 to 3 days, please return.  If your symptoms are severe, please go to the emergency room.  Follow-up with your primary care provider for further evaluation and management of your symptoms as well as ongoing wellness visits.  I hope you feel better!

## 2022-06-22 NOTE — ED Provider Notes (Signed)
[place]    CSN: [id number] Arrival date & time: 06/22/22  1538      History   Chief Complaint Chief Complaint  Patient presents with   Knee Pain    HPI Jessica Andrade is a 51 y.o. female.   Patient presents urgent care for evaluation of left knee pain that started 2 days ago.  Patient denies injury or trauma to the left knee.  Pain is worse with bending and walking long distances.  Patient also reports that when she prays her pain is worse and states that she has to bend her knees when she does her prayer rituals.  Patient denies popping or locking of the knee.  Pain is currently a 10 on a scale of 0-10.  Patient has been using "500 mg of ibuprofen" at home with some relief of knee pain.  Patient denies swelling, redness, or warmth to the left knee.  Pain is mostly to the inferior patellar region.  She has not had any medications for her knee pain today and last took ibuprofen last night.  She denies numbness and tingling to bilateral lower extremities.  No foot or ankle pain reported.  No recent falls or dizziness.  No other aggravating or relieving factors identified at this time for patient's symptoms.   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      Home Medications    Prior to Admission  medications   Medication Sig Start Date End Date Taking? Authorizing Provider  ibuprofen (ADVIL) 600 MG tablet Take 1 tablet (600 mg total) by mouth every 6 (six) hours as needed. 06/22/22  Yes Talbot Grumbling, FNP  amLODipine (NORVASC) 5 MG tablet Take 1 tablet by mouth once daily 05/26/22   Gladys Damme, MD  aspirin 81 MG tablet Take 81 mg by mouth daily.    [provider]  hydrochlorothiazide (HYDRODIURIL) 12.5 MG tablet Take 1 tablet (12.5 mg total) by mouth daily. 05/12/21   Gladys Damme, MD  atenolol-chlorthalidone (TENORETIC) 50-25 MG tablet Take 1 tablet by mouth daily. Patient not taking: Reported on 02/17/2020 04/23/16 02/17/20  Mayo, Pete Pelt, MD    Family History Family History  Problem Relation Age of Onset   Hypertension Father    Heart attack Father    Healthy Mother     Social History Social History   Tobacco Use   Smoking status: Never   Smokeless tobacco: Never  Vaping Use   Vaping Use: Never used  Substance Use Topics   Alcohol use: No   Drug use: No     Allergies   Lisinopril   Review of Systems Review of Systems Per HPI  Physical Exam Triage Vital Signs ED Triage Vitals  Enc Vitals Group     BP 06/22/22 1730  125/81     Pulse Rate 06/22/22 1730 78     Resp 06/22/22 1730 18     Temp 06/22/22 1730 98.2 F (36.8 C)     Temp Source 06/22/22 1730 Oral     SpO2 06/22/22 1730 95 %     Weight --      Height --      Head Circumference --      Peak Flow --      Pain Score 06/22/22 1728 10     Pain Loc --      Pain Edu? --      Excl. in [place]? --    No data found.  Updated Vital Signs BP 125/81 (BP Location: Right Arm) Comment (BP Location): large cuff  Pulse 78   Temp 98.2 F (36.8 C) (Oral)   Resp 18   LMP 07/05/2020   SpO2 95%   Visual Acuity Right Eye Distance:   Left Eye Distance:   Bilateral Distance:    Right Eye Near:   Left Eye Near:    Bilateral Near:     Physical Exam Vitals and nursing note reviewed.   Constitutional:      Appearance: Normal appearance. She is not ill-appearing or toxic-appearing.     Comments: Very pleasant patient sitting on exam in position of comfort table in no acute distress.   HENT:     Head: Normocephalic and atraumatic.     Right Ear: Hearing and external ear normal.     Left Ear: Hearing and external ear normal.     Nose: Nose normal.     Mouth/Throat:     Lips: Pink.     Mouth: Mucous membranes are moist.  Eyes:     General: Lids are normal. Vision grossly intact. Gaze aligned appropriately.     Extraocular Movements: Extraocular movements intact.     Conjunctiva/sclera: Conjunctivae normal.  Pulmonary:     Effort: Pulmonary effort is normal.  Abdominal:     Palpations: Abdomen is soft.  Musculoskeletal:     Cervical back: Neck supple.     Right knee: Normal.     Left knee: No swelling, erythema or ecchymosis. Normal range of motion. Tenderness present.     Comments: Pain to the inferior patella of the left knee with palpation present.  No laxity present.  Patient is able to ambulate with a small limp due to the pain.  No ecchymosis, evidence of injury, or laceration present to the knee.  Negative Homans' sign bilaterally.  No calf pain with palpation.  +2 popliteal pulses bilaterally present.  Sensation is intact distal to pain.  Capillary refill to bilateral lower extremities is less than 3.  Range of motion to the left knee is not limited by tenderness.  Pain is worse to the left knee with flexion than extension.  5/5 strength against resistance with flexion and extension of the bilateral lower extremities.  Skin:    General: Skin is warm and dry.     Capillary Refill: Capillary refill takes less than 2 seconds.     Findings: No rash.  Neurological:     General: No focal deficit present.     Mental Status: She is alert and oriented to person, place, and time. Mental status is at baseline.     Cranial Nerves: No dysarthria or facial asymmetry.      Gait: Gait is intact.  Psychiatric:        Mood and Affect: Mood normal.  Speech: Speech normal.        Behavior: Behavior normal.        Thought Content: Thought content normal.        Judgment: Judgment normal.      UC Treatments / Results  Labs (all labs ordered are listed, but only abnormal results are displayed) Labs Reviewed - No data to display  EKG   Radiology DG Knee Complete 4 Views Left  Result Date: 06/22/2022 CLINICAL DATA:  Left-sided knee pain EXAM: LEFT KNEE - COMPLETE 4+ VIEW COMPARISON:  None Available. FINDINGS: No fracture or malalignment. Mild tricompartment arthritis of the knee. No sizable effusion IMPRESSION: Mild arthritis Electronically Signed   By: Jasmine Pang M.D.   On: 06/22/2022 19:14    Procedures Procedures (including critical care time)  Medications Ordered in UC Medications  ibuprofen (ADVIL) tablet 800 mg (800 mg Oral Given 06/22/22 1859)    Initial Impression / Assessment and Plan / UC Course  I have reviewed the triage vital signs and the nursing notes.  Pertinent labs & imaging results that were available during my care of the patient were reviewed by me and considered in my medical decision making (see chart for details).  1.  Acute pain of left knee X-ray of left knee shows mild arthritis.  Suspect patient's acute knee pain is related to mild arthritis as well as a possible knee strain.  Patient given ibuprofen 800 mg in the clinic for pain management.  She may use 600 mg of ibuprofen every 6 hours as needed with food for pain at home.  She may alternate this with Tylenol 1000 mg every 6 hours as well.  Walking referral to orthopedics given.  Reassuring physical exam in clinic today.  Encourage patient to call orthopedic clinic for follow-up if her symptoms do not improve in the next week or so.  Patient to continue using knee brace for compression and stabilization of the left knee as well as comfort.  Discussed physical exam and  available lab work findings in clinic with patient.  Counseled patient regarding appropriate use of medications and potential side effects for all medications recommended or prescribed today. Discussed red flag signs and symptoms of worsening condition,when to call the PCP office, return to urgent care, and when to seek higher level of care in the emergency department. Patient verbalizes understanding and agreement with plan. All questions answered. Patient discharged in stable condition.   Final Clinical Impressions(s) / UC Diagnoses   Final diagnoses:  Acute pain of left knee     Discharge Instructions      Your knee x-ray showed some mild arthritis to the left knee.  We gave you ibuprofen in the clinic for your knee pain. You may continue to take ibuprofen every 6 hours as needed at home for your pain.  Do not take any ibuprofen until tomorrow morning since we gave you some in the clinic.  Take this medicine with food to avoid stomach upset.  Call the orthopedic doctor listed on your paperwork for follow-up appointment regarding your knee pain.  Continue wearing the knee brace to decrease pain and inflammation.  If you develop any new or worsening symptoms or do not improve in the next 2 to 3 days, please return.  If your symptoms are severe, please go to the emergency room.  Follow-up with your primary care provider for further evaluation and management of your symptoms as well as ongoing wellness visits.  I hope you feel better!  ED Prescriptions     Medication Sig Dispense Auth. Provider   ibuprofen (ADVIL) 600 MG tablet Take 1 tablet (600 mg total) by mouth every 6 (six) hours as needed. 30 tablet Carlisle Beers, FNP      PDMP not reviewed this encounter.   Carlisle Beers, [place] 06/22/22 1931

## 2022-06-22 NOTE — ED Triage Notes (Signed)
Left knee pain that started 2 days ago.  No known injury.  Complains of swelling to left knee. Patient had a knee brace on left knee on arrival to treatment room.  Patient has been taking ibuprofen.

## 2022-06-29 ENCOUNTER — Ambulatory Visit: Payer: Managed Care, Other (non HMO) | Admitting: Family Medicine

## 2022-07-10 ENCOUNTER — Ambulatory Visit (INDEPENDENT_AMBULATORY_CARE_PROVIDER_SITE_OTHER): Payer: Commercial Managed Care - HMO | Admitting: Family Medicine

## 2022-07-10 ENCOUNTER — Encounter: Payer: Self-pay | Admitting: Family Medicine

## 2022-07-10 VITALS — BP 126/69 | HR 82 | Ht 60.0 in | Wt 151.2 lb

## 2022-07-10 DIAGNOSIS — M25562 Pain in left knee: Secondary | ICD-10-CM | POA: Insufficient documentation

## 2022-07-10 DIAGNOSIS — M1712 Unilateral primary osteoarthritis, left knee: Secondary | ICD-10-CM | POA: Diagnosis not present

## 2022-07-10 MED ORDER — DICLOFENAC SODIUM 75 MG PO TBEC: 75.0000 mg | DELAYED_RELEASE_TABLET | Freq: Two times a day (BID) | ORAL | 3 refills | Status: DC

## 2022-07-10 NOTE — Progress Notes (Signed)
    SUBJECTIVE:   CHIEF COMPLAINT / HPI:    Patient presents with intermittent left wrist pain for many months, she has discussed with prior PCP who recommended steroid injection but patient refused. Wrist pain continues to hurt. Now she presents with concern for new left knee pain that started more than a month ago. At the time when it started, she went to he urgent care and they had imaging performed which showed mild arthritis. She was told to go to a bone doctor and they injected her which helped slightly but she still has pain. Sometimes radiates down her leg. Denies any trauma or injury. Still able to walk and do activities of daily living. Has tried advil and tylenol for the pain which helps a little. Standing up makes the pain worse. Laying down and stretching makes it better. Had back pain in the past but not now.   OBJECTIVE:   BP 126/69   Pulse 82   Ht 5' (1.524 m)   Wt 151 lb 3.2 oz (68.6 kg)   LMP 07/05/2020   SpO2 100%   BMI 29.53 kg/m   General: Patient well-appearing, in no acute distress. CV: RRR, no murmurs or gallops auscultated Resp: CTAB, no wheezing, rales or rhonchi noted MSK: no erythema or edema of wrists bilaterally upon inspection, full active ROM along both wrists, negative Tinel's and Finklestein's testing, no erythema or edema noted along inspection of both knees, no crepitus bilaterally, full active and passive ROM bilaterally, negative anterior drawer bilaterally, negative Lachman's testing, negative McMurray's testing Neuro: normal grip strength bilaterally, 5/5 UE and LE strength bilaterally, gross sensation intact, normal gait  ASSESSMENT/PLAN:   Left knee pain -acute left knee pain with prior imaging reassuring for no fracture or dislocation, demonstrated mild osteoarthritis. Low suspicion for ACL or meniscus tear. Further imaging not indicated at this time. Pain likely secondary to osteoarthritis although mild now.  -PT referral placed  -voltaren gel  prescribed -reassurance provided, may take tylenol or ibuprofen as appropriate -discussed that knee brace may help when standing for longer periods of time -follow up in about a month for physical and health maintenance    -PHQ-9 score of 0 reviewed.   Reece Leader, DO [facility]

## 2022-07-10 NOTE — Patient Instructions (Signed)
It was great seeing you today!  Today we discussed your knee pain, this is due to osteoarthritis. The best treatment for this is to stay active and do stretching exercises. I have referred you to physical therapy, if you do not hear from them within 2 weeks then please contact our clinic so we can try to assist with scheduling.  You may wear a knee brace when you are doing household chores, you can get this at any pharmacy.   I have prescribed voltaren gel, please apply this to your left knee to help ease some of the pain.   You may take tylenol or ibuprofen if needed for the pain.   Please follow up at your next scheduled appointment, if anything arises between now and then, please don't hesitate to contact our office.   Thank you for allowing [place] to be a part of your medical care!  Thank you, Dr. Robyne Peers

## 2022-07-10 NOTE — Assessment & Plan Note (Addendum)
-  acute left knee pain with prior imaging reassuring for no fracture or dislocation, demonstrated mild osteoarthritis. Low suspicion for ACL or meniscus tear. Further imaging not indicated at this time. Pain likely secondary to osteoarthritis although mild now.  -PT referral placed  -voltaren gel prescribed -reassurance provided, may take tylenol or ibuprofen as appropriate -discussed that knee brace may help when standing for longer periods of time -follow up in about a month for physical and health maintenance

## 2022-07-18 ENCOUNTER — Ambulatory Visit: Payer: Commercial Managed Care - HMO | Attending: Family Medicine

## 2022-07-18 DIAGNOSIS — R293 Abnormal posture: Secondary | ICD-10-CM | POA: Diagnosis present

## 2022-07-18 DIAGNOSIS — G8929 Other chronic pain: Secondary | ICD-10-CM | POA: Insufficient documentation

## 2022-07-18 DIAGNOSIS — M25562 Pain in left knee: Secondary | ICD-10-CM | POA: Diagnosis present

## 2022-07-18 DIAGNOSIS — M6281 Muscle weakness (generalized): Secondary | ICD-10-CM | POA: Diagnosis present

## 2022-07-18 DIAGNOSIS — M1712 Unilateral primary osteoarthritis, left knee: Secondary | ICD-10-CM | POA: Diagnosis present
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
## 2022-07-20 ENCOUNTER — Ambulatory Visit: Payer: Commercial Managed Care - HMO

## 2022-07-22 ENCOUNTER — Telehealth: Payer: Self-pay

## 2022-07-22 NOTE — Telephone Encounter (Signed)
Spoke with patient regarding missed PT appointment on 07/20/22. She reports a work conflict. Reviewed attendance policy and reminded patient of next scheduled visit.   Letitia Libra, PT, DPT, ATC 07/22/22 8:37 AM

## 2022-07-23 ENCOUNTER — Ambulatory Visit: Payer: Commercial Managed Care - HMO

## 2022-07-27 ENCOUNTER — Ambulatory Visit: Payer: Commercial Managed Care - HMO

## 2022-08-01 ENCOUNTER — Ambulatory Visit: Payer: Commercial Managed Care - HMO

## 2022-08-03 ENCOUNTER — Ambulatory Visit: Payer: Commercial Managed Care - HMO

## 2022-08-06 ENCOUNTER — Ambulatory Visit: Payer: Commercial Managed Care - HMO

## 2022-08-11 ENCOUNTER — Ambulatory Visit (INDEPENDENT_AMBULATORY_CARE_PROVIDER_SITE_OTHER): Payer: Commercial Managed Care - HMO | Admitting: Family Medicine

## 2022-08-11 ENCOUNTER — Encounter: Payer: Self-pay | Admitting: Family Medicine

## 2022-08-11 VITALS — BP 120/62 | HR 78 | Ht 61.0 in | Wt 152.0 lb

## 2022-08-11 DIAGNOSIS — I10 Essential (primary) hypertension: Secondary | ICD-10-CM

## 2022-08-11 DIAGNOSIS — Z0000 Encounter for general adult medical examination without abnormal findings: Secondary | ICD-10-CM | POA: Diagnosis not present

## 2022-08-11 MED ORDER — HYDROCHLOROTHIAZIDE 12.5 MG PO TABS: 12.5000 mg | ORAL_TABLET | Freq: Every day | ORAL | 3 refills | Status: DC

## 2022-08-11 NOTE — Assessment & Plan Note (Addendum)
-  BP 120/62, well-controlled -continue amlodipine and HCTZ -pending BMP to monitor electrolytes and renal function  -diet and exercise counseling

## 2022-08-11 NOTE — Progress Notes (Signed)
    SUBJECTIVE:   CHIEF COMPLAINT / HPI:   Patient presents for a physical, she denies any concerns. Has a history of hypertension, compliant on her regimen of HCTZ and amlodipine. Denies chest pain, dyspnea, leg swelling or any symptoms. She has never smoked and has never consumed alcohol. Denies both history and current drug use.   OBJECTIVE:   BP 120/62   Pulse 78   Ht 5\' 1"  (1.549 m)   Wt 152 lb (68.9 kg)   LMP 07/05/2020   SpO2 98%   BMI 28.72 kg/m   General: Patient well-appearing, in no acute distress. HEENT: PERRLA, normal buccal mucosa, non-tender thyroid, no evidence of cervical LAD CV: RRR, no murmurs or gallops auscultated Resp: CTAB, no wheezing, rales or rhonchi noted Abdomen: soft, nontender, nondistended, presence of bowel sounds Ext: no LE edema noted Psych: mood appropriate   ASSESSMENT/PLAN:   Essential hypertension -BP 120/62, well-controlled -continue amlodipine and HCTZ -pending BMP to monitor electrolytes and renal function  -diet and exercise counseling   Health maintenance -Pending lipid panel -Up to date on PAP smear, due next year -Mammogram scheduled for 08/28/2022. -She has not had a colonoscopy yet, discussed importance of colonoscopy as a screening tool for colon cancer. Patient politely declines therefore GI referral not placed. Informed patient to let 08/30/2022 know when she is ready for this.    [place], DO [facility]

## 2022-08-11 NOTE — Patient Instructions (Signed)
It was great seeing you today!  Today we performed your physical. Your blood pressure is 120/62 which is great! Please continue to take your hydrochlorothiazide and amlodipine, I have sent refills on your hydrochlorothiazide. You will be due for a PAP smear next year. Your mammogram is scheduled on 9/22 at 3pm at the [facility] If you change your mind about the colonoscopy, then please let us know. We will get blood work, I will let you know of any abnormal results.   Please follow up at your next scheduled appointment in 1 year, if anything arises between now and then, please don't hesitate to contact our office.   Thank you for allowing [place] to be a part of your medical care!  Thank you, Dr. Robyne Peers

## 2022-08-12 ENCOUNTER — Other Ambulatory Visit: Payer: Self-pay | Admitting: Family Medicine

## 2022-08-12 DIAGNOSIS — E876 Hypokalemia: Secondary | ICD-10-CM

## 2022-08-12 LAB — LIPID PANEL
Chol/HDL Ratio: 3.9 ratio (ref 0.0–4.4)
Cholesterol, Total: 199 mg/dL (ref 100–199)
HDL: 51 mg/dL (ref >39)
LDL Chol Calc (NIH): 120 mg/dL — ABNORMAL HIGH (ref 0–99)
Triglycerides: 157 mg/dL — ABNORMAL HIGH (ref 0–149)
VLDL Cholesterol Cal: 28 mg/dL (ref 5–40)

## 2022-08-12 LAB — BASIC METABOLIC PANEL
BUN/Creatinine Ratio: 25 — ABNORMAL HIGH (ref 9–23)
BUN: 20 mg/dL (ref 6–24)
CO2: 30 mmol/L — ABNORMAL HIGH (ref 20–29)
Calcium: 9.4 mg/dL (ref 8.7–10.2)
Chloride: 99 mmol/L (ref 96–106)
Creatinine, Ser: 0.81 mg/dL (ref 0.57–1.00)
Glucose: 141 mg/dL — ABNORMAL HIGH (ref 70–99)
Potassium: 3 mmol/L — ABNORMAL LOW (ref 3.5–5.2)
Sodium: 143 mmol/L (ref 134–144)
eGFR: 88 mL/min/{1.73_m2} (ref >59)

## 2022-08-12 MED ORDER — POTASSIUM CHLORIDE CRYS ER 20 MEQ PO TBCR: 40.0000 meq | EXTENDED_RELEASE_TABLET | Freq: Once | ORAL | 0 refills | Status: DC

## 2022-08-17 ENCOUNTER — Telehealth: Payer: Self-pay

## 2022-08-17 NOTE — Progress Notes (Signed)
Tried to contact the pt to set up lab visit. Pt did not answer the phone, I will try again tomorrow.

## 2022-08-19 ENCOUNTER — Other Ambulatory Visit: Payer: Self-pay

## 2022-08-19 ENCOUNTER — Emergency Department (HOSPITAL_COMMUNITY): Payer: Commercial Managed Care - HMO

## 2022-08-19 ENCOUNTER — Inpatient Hospital Stay (HOSPITAL_COMMUNITY)
Admission: EM | Admit: 2022-08-19 | Discharge: 2022-08-25 | DRG: 958 | Disposition: A | Discharge status: Rehabilitation Facility | Payer: Commercial Managed Care - HMO | Attending: General Surgery | Admitting: General Surgery

## 2022-08-19 ENCOUNTER — Encounter (HOSPITAL_COMMUNITY): Payer: Self-pay | Admitting: Surgery

## 2022-08-19 DIAGNOSIS — Z23 Encounter for immunization: Secondary | ICD-10-CM

## 2022-08-19 DIAGNOSIS — I10 Essential (primary) hypertension: Secondary | ICD-10-CM | POA: Diagnosis present

## 2022-08-19 DIAGNOSIS — S32039A Unspecified fracture of third lumbar vertebra, initial encounter for closed fracture: Secondary | ICD-10-CM | POA: Diagnosis present

## 2022-08-19 DIAGNOSIS — R0902 Hypoxemia: Secondary | ICD-10-CM | POA: Diagnosis present

## 2022-08-19 DIAGNOSIS — S2220XA Unspecified fracture of sternum, initial encounter for closed fracture: Secondary | ICD-10-CM | POA: Diagnosis present

## 2022-08-19 DIAGNOSIS — Z79899 Other long term (current) drug therapy: Secondary | ICD-10-CM

## 2022-08-19 DIAGNOSIS — Y92410 Unspecified street and highway as the place of occurrence of the external cause: Secondary | ICD-10-CM | POA: Diagnosis not present

## 2022-08-19 DIAGNOSIS — R402362 Coma scale, best motor response, obeys commands, at arrival to emergency department: Secondary | ICD-10-CM | POA: Diagnosis present

## 2022-08-19 DIAGNOSIS — Z20822 Contact with and (suspected) exposure to covid-19: Secondary | ICD-10-CM | POA: Diagnosis present

## 2022-08-19 DIAGNOSIS — Z7982 Long term (current) use of aspirin: Secondary | ICD-10-CM | POA: Diagnosis not present

## 2022-08-19 DIAGNOSIS — S12100S Unspecified displaced fracture of second cervical vertebra, sequela: Secondary | ICD-10-CM | POA: Diagnosis not present

## 2022-08-19 DIAGNOSIS — S066X9A Traumatic subarachnoid hemorrhage with loss of consciousness of unspecified duration, initial encounter: Secondary | ICD-10-CM | POA: Diagnosis present

## 2022-08-19 DIAGNOSIS — S22089A Unspecified fracture of T11-T12 vertebra, initial encounter for closed fracture: Secondary | ICD-10-CM | POA: Diagnosis present

## 2022-08-19 DIAGNOSIS — I609 Nontraumatic subarachnoid hemorrhage, unspecified: Principal | ICD-10-CM

## 2022-08-19 DIAGNOSIS — D62 Acute posthemorrhagic anemia: Secondary | ICD-10-CM | POA: Diagnosis not present

## 2022-08-19 DIAGNOSIS — E876 Hypokalemia: Secondary | ICD-10-CM | POA: Diagnosis present

## 2022-08-19 DIAGNOSIS — S27321A Contusion of lung, unilateral, initial encounter: Secondary | ICD-10-CM | POA: Diagnosis present

## 2022-08-19 DIAGNOSIS — S27329A Contusion of lung, unspecified, initial encounter: Secondary | ICD-10-CM | POA: Diagnosis present

## 2022-08-19 DIAGNOSIS — R402242 Coma scale, best verbal response, confused conversation, at arrival to emergency department: Secondary | ICD-10-CM | POA: Diagnosis present

## 2022-08-19 DIAGNOSIS — S129XXA Fracture of neck, unspecified, initial encounter: Secondary | ICD-10-CM | POA: Diagnosis present

## 2022-08-19 DIAGNOSIS — S2242XS Multiple fractures of ribs, left side, sequela: Secondary | ICD-10-CM | POA: Diagnosis not present

## 2022-08-19 DIAGNOSIS — E785 Hyperlipidemia, unspecified: Secondary | ICD-10-CM | POA: Diagnosis present

## 2022-08-19 DIAGNOSIS — S12200A Unspecified displaced fracture of third cervical vertebra, initial encounter for closed fracture: Secondary | ICD-10-CM | POA: Diagnosis not present

## 2022-08-19 DIAGNOSIS — S32019A Unspecified fracture of first lumbar vertebra, initial encounter for closed fracture: Secondary | ICD-10-CM | POA: Diagnosis present

## 2022-08-19 DIAGNOSIS — S12130S Unspecified traumatic displaced spondylolisthesis of second cervical vertebra, sequela: Secondary | ICD-10-CM | POA: Diagnosis not present

## 2022-08-19 DIAGNOSIS — R7303 Prediabetes: Secondary | ICD-10-CM | POA: Diagnosis not present

## 2022-08-19 DIAGNOSIS — S32009A Unspecified fracture of unspecified lumbar vertebra, initial encounter for closed fracture: Secondary | ICD-10-CM | POA: Diagnosis present

## 2022-08-19 DIAGNOSIS — S2249XA Multiple fractures of ribs, unspecified side, initial encounter for closed fracture: Secondary | ICD-10-CM | POA: Diagnosis present

## 2022-08-19 DIAGNOSIS — R402132 Coma scale, eyes open, to sound, at arrival to emergency department: Secondary | ICD-10-CM | POA: Diagnosis present

## 2022-08-19 DIAGNOSIS — S066XAD Traumatic subarachnoid hemorrhage with loss of consciousness status unknown, subsequent encounter: Secondary | ICD-10-CM | POA: Diagnosis not present

## 2022-08-19 DIAGNOSIS — T1490XA Injury, unspecified, initial encounter: Secondary | ICD-10-CM | POA: Diagnosis not present

## 2022-08-19 DIAGNOSIS — S2242XA Multiple fractures of ribs, left side, initial encounter for closed fracture: Secondary | ICD-10-CM | POA: Diagnosis present

## 2022-08-19 DIAGNOSIS — R7989 Other specified abnormal findings of blood chemistry: Secondary | ICD-10-CM | POA: Diagnosis not present

## 2022-08-19 DIAGNOSIS — S22009A Unspecified fracture of unspecified thoracic vertebra, initial encounter for closed fracture: Secondary | ICD-10-CM | POA: Diagnosis present

## 2022-08-19 HISTORY — DX: Hyperlipidemia, unspecified: E78.5

## 2022-08-19 HISTORY — DX: Essential (primary) hypertension: I10

## 2022-08-19 LAB — COMPREHENSIVE METABOLIC PANEL
ALT: 69 U/L — ABNORMAL HIGH (ref 0–44)
AST: 131 U/L — ABNORMAL HIGH (ref 15–41)
Albumin: 3.3 g/dL — ABNORMAL LOW (ref 3.5–5.0)
Alkaline Phosphatase: 74 U/L (ref 38–126)
Anion gap: 12 (ref 5–15)
BUN: 18 mg/dL (ref 6–20)
CO2: 23 mmol/L (ref 22–32)
Calcium: 8.9 mg/dL (ref 8.9–10.3)
Chloride: 103 mmol/L (ref 98–111)
Creatinine, Ser: 1.12 mg/dL — ABNORMAL HIGH (ref 0.44–1.00)
GFR, Estimated: 33 mL/min — ABNORMAL LOW (ref >60)
Glucose, Bld: 214 mg/dL — ABNORMAL HIGH (ref 70–99)
Potassium: 3 mmol/L — ABNORMAL LOW (ref 3.5–5.1)
Sodium: 138 mmol/L (ref 135–145)
Total Bilirubin: 0.8 mg/dL (ref 0.3–1.2)
Total Protein: 7.4 g/dL (ref 6.5–8.1)

## 2022-08-19 LAB — CBC
HCT: 44.5 % (ref 36.0–46.0)
Hemoglobin: 14.5 g/dL (ref 12.0–15.0)
MCH: 28 pg (ref 26.0–34.0)
MCHC: 32.6 g/dL (ref 30.0–36.0)
MCV: 86.1 fL (ref 80.0–100.0)
Platelets: 218 10*3/uL (ref 150–400)
RBC: 5.17 MIL/uL — ABNORMAL HIGH (ref 3.87–5.11)
RDW: 13.2 % (ref 11.5–15.5)
WBC: 15.9 10*3/uL — ABNORMAL HIGH (ref 4.0–10.5)
nRBC: 0 % (ref 0.0–0.2)

## 2022-08-19 LAB — SAMPLE TO BLOOD BANK

## 2022-08-19 LAB — I-STAT CHEM 8, ED
BUN: 20 mg/dL (ref 6–20)
Calcium, Ion: 1.13 mmol/L — ABNORMAL LOW (ref 1.15–1.40)
Chloride: 100 mmol/L (ref 98–111)
Creatinine, Ser: 1.1 mg/dL — ABNORMAL HIGH (ref 0.44–1.00)
Glucose, Bld: 213 mg/dL — ABNORMAL HIGH (ref 70–99)
HCT: 46 % (ref 36.0–46.0)
Hemoglobin: 15.6 g/dL — ABNORMAL HIGH (ref 12.0–15.0)
Potassium: 3 mmol/L — ABNORMAL LOW (ref 3.5–5.1)
Sodium: 139 mmol/L (ref 135–145)
TCO2: 27 mmol/L (ref 22–32)

## 2022-08-19 LAB — PROTIME-INR
INR: 1.2 (ref 0.8–1.2)
Prothrombin Time: 14.8 seconds (ref 11.4–15.2)

## 2022-08-19 LAB — CBG MONITORING, ED: Glucose-Capillary: 194 mg/dL — ABNORMAL HIGH (ref 70–99)

## 2022-08-19 MED ORDER — DOCUSATE SODIUM 100 MG PO CAPS
100.0000 mg | ORAL_CAPSULE | Freq: Two times a day (BID) | ORAL | Status: DC
  Administered 2022-08-21 – 2022-08-25 (×9): 100 mg via ORAL
  Filled 2022-08-19 (×12): qty 1

## 2022-08-19 MED ORDER — HYDROMORPHONE HCL 1 MG/ML IJ SOLN
0.5000 mg | INTRAMUSCULAR | Status: DC | PRN
  Administered 2022-08-20 – 2022-08-21 (×4): 0.5 mg via INTRAVENOUS
  Filled 2022-08-19 (×4): qty 1

## 2022-08-19 MED ORDER — IOHEXOL 350 MG/ML SOLN
80.0000 mL | Freq: Once | INTRAVENOUS | Status: AC | PRN
  Administered 2022-08-19: 80 mL via INTRAVENOUS

## 2022-08-19 MED ORDER — FENTANYL CITRATE (PF) 100 MCG/2ML IJ SOLN
INTRAMUSCULAR | Status: AC
  Administered 2022-08-19: 50 ug
  Filled 2022-08-19: qty 2

## 2022-08-19 MED ORDER — TETANUS-DIPHTH-ACELL PERTUSSIS 5-2.5-18.5 LF-MCG/0.5 IM SUSY
0.5000 mL | PREFILLED_SYRINGE | Freq: Once | INTRAMUSCULAR | Status: AC
  Administered 2022-08-20: 0.5 mL via INTRAMUSCULAR
  Filled 2022-08-19: qty 0.5

## 2022-08-19 MED ORDER — IOHEXOL 350 MG/ML SOLN
100.0000 mL | Freq: Once | INTRAVENOUS | Status: AC | PRN
  Administered 2022-08-19: 75 mL via INTRAVENOUS

## 2022-08-19 MED ORDER — ONDANSETRON HCL 4 MG/2ML IJ SOLN
4.0000 mg | Freq: Four times a day (QID) | INTRAMUSCULAR | Status: DC | PRN
  Administered 2022-08-23: 4 mg via INTRAVENOUS
  Filled 2022-08-19: qty 2

## 2022-08-19 MED ORDER — INSULIN ASPART 100 UNIT/ML IJ SOLN
0.0000 [IU] | INTRAMUSCULAR | Status: DC
  Administered 2022-08-20 (×2): 2 [IU] via SUBCUTANEOUS
  Administered 2022-08-20: 3 [IU] via SUBCUTANEOUS
  Administered 2022-08-20 – 2022-08-21 (×4): 2 [IU] via SUBCUTANEOUS
  Administered 2022-08-22: 3 [IU] via SUBCUTANEOUS
  Administered 2022-08-22 – 2022-08-23 (×5): 2 [IU] via SUBCUTANEOUS
  Administered 2022-08-23: 3 [IU] via SUBCUTANEOUS
  Administered 2022-08-23: 2 [IU] via SUBCUTANEOUS
  Administered 2022-08-23 – 2022-08-24 (×2): 3 [IU] via SUBCUTANEOUS
  Administered 2022-08-24 (×2): 2 [IU] via SUBCUTANEOUS
  Administered 2022-08-25: 3 [IU] via SUBCUTANEOUS
  Administered 2022-08-25: 2 [IU] via SUBCUTANEOUS

## 2022-08-19 MED ORDER — FENTANYL CITRATE PF 50 MCG/ML IJ SOSY
50.0000 ug | PREFILLED_SYRINGE | Freq: Once | INTRAMUSCULAR | Status: AC
  Administered 2022-08-19: 50 ug via INTRAVENOUS

## 2022-08-19 MED ORDER — OXYCODONE HCL 5 MG PO TABS
5.0000 mg | ORAL_TABLET | ORAL | Status: DC | PRN
  Administered 2022-08-20: 10 mg via ORAL
  Administered 2022-08-21 (×2): 5 mg via ORAL
  Administered 2022-08-21 – 2022-08-22 (×2): 10 mg via ORAL
  Administered 2022-08-22: 5 mg via ORAL
  Filled 2022-08-19: qty 2
  Filled 2022-08-19: qty 1
  Filled 2022-08-19: qty 2
  Filled 2022-08-19: qty 1
  Filled 2022-08-19: qty 2
  Filled 2022-08-19: qty 1

## 2022-08-19 MED ORDER — ACETAMINOPHEN 325 MG PO TABS
650.0000 mg | ORAL_TABLET | Freq: Four times a day (QID) | ORAL | Status: DC
  Filled 2022-08-19: qty 2

## 2022-08-19 MED ORDER — ONDANSETRON 4 MG PO TBDP: 4.0000 mg | ORAL_TABLET | Freq: Four times a day (QID) | ORAL | Status: DC | PRN

## 2022-08-19 MED ORDER — SODIUM CHLORIDE 0.9 % IV SOLN: INTRAVENOUS | Status: DC

## 2022-08-19 MED ORDER — POTASSIUM CHLORIDE 10 MEQ/100ML IV SOLN
10.0000 meq | INTRAVENOUS | Status: AC
  Administered 2022-08-20 (×5): 10 meq via INTRAVENOUS
  Filled 2022-08-19 (×5): qty 100

## 2022-08-19 NOTE — Progress Notes (Signed)
   08/19/22 2205  Clinical Encounter Type  Visited With Patient not available;Health care provider  Visit Type ED;Trauma;Initial  Referral From Nurse Marland KitchenTacey Heap, RN)  Consult/Referral To Chaplain Benetta Spar)  Recommendations Level 1 Trauma   Responded to page in [facility] Trauma Room A for Level 1 Trauma. Patient being evaluated and treated by medical staff at this time, patient not seen by Chaplain. No family present at this time. Staff will page Chaplain upon request of patient or family.  Chaplain Barba Solt, M.Min., [phone].

## 2022-08-19 NOTE — ED Notes (Signed)
Pt transported to CT with RN

## 2022-08-19 NOTE — Progress Notes (Signed)
Orthopedic Tech Progress Note Patient Details:  Jessica Andrade 12/07/1874 [id number]  Patient ID: [place] [facility] Andrade, female   DOB: 12/07/1874, 51 y.o.   MRN: [id number] Level I; not currently needed. Darleen Crocker 08/19/2022, 10:23 PM

## 2022-08-19 NOTE — ED Notes (Addendum)
Pt log rolled with cspine precautions maintained. [place] J placed. Significant amount of glass shards removed from patient face/eyes/neck.
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
## 2022-08-19 NOTE — ED Notes (Signed)
PT returned from CT

## 2022-08-20 ENCOUNTER — Other Ambulatory Visit: Payer: Self-pay

## 2022-08-20 ENCOUNTER — Inpatient Hospital Stay (HOSPITAL_COMMUNITY): Payer: Commercial Managed Care - HMO | Admitting: Anesthesiology

## 2022-08-20 ENCOUNTER — Encounter (HOSPITAL_COMMUNITY): Payer: Self-pay | Admitting: Surgery

## 2022-08-20 ENCOUNTER — Inpatient Hospital Stay (HOSPITAL_COMMUNITY): Payer: Commercial Managed Care - HMO

## 2022-08-20 ENCOUNTER — Encounter (HOSPITAL_COMMUNITY): Admission: EM | Disposition: A | Discharge status: Rehabilitation Facility | Payer: Self-pay | Source: Home / Self Care

## 2022-08-20 DIAGNOSIS — S12200A Unspecified displaced fracture of third cervical vertebra, initial encounter for closed fracture: Secondary | ICD-10-CM

## 2022-08-20 DIAGNOSIS — I10 Essential (primary) hypertension: Secondary | ICD-10-CM

## 2022-08-20 HISTORY — PX: ANTERIOR CERVICAL DECOMP/DISCECTOMY FUSION: SHX1161

## 2022-08-20 LAB — CBG MONITORING, ED: Glucose-Capillary: 154 mg/dL — ABNORMAL HIGH (ref 70–99)

## 2022-08-20 LAB — CBC
HCT: 41.3 % (ref 36.0–46.0)
Hemoglobin: 14 g/dL (ref 12.0–15.0)
MCH: 28.7 pg (ref 26.0–34.0)
MCHC: 33.9 g/dL (ref 30.0–36.0)
MCV: 84.8 fL (ref 80.0–100.0)
Platelets: 185 10*3/uL (ref 150–400)
RBC: 4.87 MIL/uL (ref 3.87–5.11)
RDW: 13.3 % (ref 11.5–15.5)
WBC: 17.9 10*3/uL — ABNORMAL HIGH (ref 4.0–10.5)
nRBC: 0 % (ref 0.0–0.2)

## 2022-08-20 LAB — BASIC METABOLIC PANEL
Anion gap: 13 (ref 5–15)
BUN: 18 mg/dL (ref 6–20)
CO2: 25 mmol/L (ref 22–32)
Calcium: 8.8 mg/dL — ABNORMAL LOW (ref 8.9–10.3)
Chloride: 102 mmol/L (ref 98–111)
Creatinine, Ser: 1.02 mg/dL — ABNORMAL HIGH (ref 0.44–1.00)
GFR, Estimated: >60 mL/min (ref >60)
Glucose, Bld: 167 mg/dL — ABNORMAL HIGH (ref 70–99)
Potassium: 3.3 mmol/L — ABNORMAL LOW (ref 3.5–5.1)
Sodium: 140 mmol/L (ref 135–145)

## 2022-08-20 LAB — MRSA NEXT GEN BY PCR, NASAL: MRSA by PCR Next Gen: NOT DETECTED

## 2022-08-20 LAB — RESP PANEL BY RT-PCR (FLU A&B, COVID) ARPGX2
Influenza A by PCR: NEGATIVE
Influenza B by PCR: NEGATIVE
SARS Coronavirus 2 by RT PCR: NEGATIVE

## 2022-08-20 LAB — GLUCOSE, CAPILLARY
Glucose-Capillary: 130 mg/dL — ABNORMAL HIGH (ref 70–99)
Glucose-Capillary: 131 mg/dL — ABNORMAL HIGH (ref 70–99)
Glucose-Capillary: 138 mg/dL — ABNORMAL HIGH (ref 70–99)
Glucose-Capillary: 172 mg/dL — ABNORMAL HIGH (ref 70–99)
Glucose-Capillary: 99 mg/dL (ref 70–99)

## 2022-08-20 LAB — ETHANOL: Alcohol, Ethyl (B): <10 mg/dL (ref <10)

## 2022-08-20 LAB — LACTIC ACID, PLASMA: Lactic Acid, Venous: 4.1 mmol/L (ref 0.5–1.9)

## 2022-08-20 LAB — HIV ANTIBODY (ROUTINE TESTING W REFLEX): HIV Screen 4th Generation wRfx: NONREACTIVE

## 2022-08-20 SURGERY — ANTERIOR CERVICAL DECOMPRESSION/DISCECTOMY FUSION 1 LEVEL
Anesthesia: General

## 2022-08-20 MED ORDER — ROCURONIUM BROMIDE 10 MG/ML (PF) SYRINGE
PREFILLED_SYRINGE | INTRAVENOUS | Status: DC | PRN
  Administered 2022-08-20: 50 mg via INTRAVENOUS
  Administered 2022-08-20: 30 mg via INTRAVENOUS

## 2022-08-20 MED ORDER — LIDOCAINE-EPINEPHRINE 1 %-1:100000 IJ SOLN
INTRAMUSCULAR | Status: DC | PRN
  Administered 2022-08-20: 10 mL

## 2022-08-20 MED ORDER — THROMBIN 5000 UNITS EX SOLR
OROMUCOSAL | Status: DC | PRN
  Administered 2022-08-20: 5 mL via TOPICAL

## 2022-08-20 MED ORDER — LACTATED RINGERS IV SOLN: INTRAVENOUS | Status: DC | PRN

## 2022-08-20 MED ORDER — HYDROCHLOROTHIAZIDE 12.5 MG PO CAPS
12.5000 mg | ORAL_CAPSULE | Freq: Every day | ORAL | Status: DC
  Filled 2022-08-20 (×2): qty 1

## 2022-08-20 MED ORDER — CHLORHEXIDINE GLUCONATE 0.12 % MT SOLN
15.0000 mL | Freq: Once | OROMUCOSAL | Status: AC
  Administered 2022-08-20: 15 mL via OROMUCOSAL
  Filled 2022-08-20: qty 15

## 2022-08-20 MED ORDER — ENOXAPARIN SODIUM 30 MG/0.3ML IJ SOSY: 30.0000 mg | PREFILLED_SYRINGE | Freq: Two times a day (BID) | INTRAMUSCULAR | Status: DC

## 2022-08-20 MED ORDER — LEVETIRACETAM IN NACL 500 MG/100ML IV SOLN
500.0000 mg | Freq: Two times a day (BID) | INTRAVENOUS | Status: DC
  Administered 2022-08-20 (×2): 500 mg via INTRAVENOUS
  Filled 2022-08-20 (×2): qty 100

## 2022-08-20 MED ORDER — FENTANYL CITRATE (PF) 250 MCG/5ML IJ SOLN
INTRAMUSCULAR | Status: DC | PRN
  Administered 2022-08-20 (×2): 50 ug via INTRAVENOUS
  Administered 2022-08-20: 100 ug via INTRAVENOUS

## 2022-08-20 MED ORDER — POTASSIUM CHLORIDE 20 MEQ PO PACK: 40.0000 meq | PACK | Freq: Every day | ORAL | Status: DC

## 2022-08-20 MED ORDER — LIDOCAINE 2% (20 MG/ML) 5 ML SYRINGE
INTRAMUSCULAR | Status: AC
  Filled 2022-08-20: qty 5

## 2022-08-20 MED ORDER — CEFAZOLIN SODIUM-DEXTROSE 2-3 GM-%(50ML) IV SOLR
INTRAVENOUS | Status: DC | PRN
  Administered 2022-08-20: 2 g via INTRAVENOUS

## 2022-08-20 MED ORDER — KETOROLAC TROMETHAMINE 15 MG/ML IJ SOLN
30.0000 mg | Freq: Four times a day (QID) | INTRAMUSCULAR | Status: AC
  Administered 2022-08-20 – 2022-08-25 (×19): 30 mg via INTRAVENOUS
  Filled 2022-08-20 (×20): qty 2

## 2022-08-20 MED ORDER — ROCURONIUM BROMIDE 10 MG/ML (PF) SYRINGE
PREFILLED_SYRINGE | INTRAVENOUS | Status: AC
  Filled 2022-08-20: qty 10

## 2022-08-20 MED ORDER — ONDANSETRON HCL 4 MG/2ML IJ SOLN
INTRAMUSCULAR | Status: AC
  Filled 2022-08-20: qty 2

## 2022-08-20 MED ORDER — DEXAMETHASONE SODIUM PHOSPHATE 10 MG/ML IJ SOLN
INTRAMUSCULAR | Status: AC
  Filled 2022-08-20: qty 1

## 2022-08-20 MED ORDER — ENOXAPARIN SODIUM 30 MG/0.3ML IJ SOSY
30.0000 mg | PREFILLED_SYRINGE | Freq: Two times a day (BID) | INTRAMUSCULAR | Status: DC
  Administered 2022-08-22 – 2022-08-25 (×7): 30 mg via SUBCUTANEOUS
  Filled 2022-08-20 (×7): qty 0.3

## 2022-08-20 MED ORDER — CEFAZOLIN SODIUM-DEXTROSE 2-4 GM/100ML-% IV SOLN
INTRAVENOUS | Status: AC
  Filled 2022-08-20: qty 100

## 2022-08-20 MED ORDER — ACETAMINOPHEN 10 MG/ML IV SOLN
INTRAVENOUS | Status: AC
  Filled 2022-08-20: qty 100

## 2022-08-20 MED ORDER — SUGAMMADEX SODIUM 200 MG/2ML IV SOLN
INTRAVENOUS | Status: DC | PRN
  Administered 2022-08-20: 200 mg via INTRAVENOUS

## 2022-08-20 MED ORDER — FENTANYL CITRATE (PF) 250 MCG/5ML IJ SOLN
INTRAMUSCULAR | Status: AC
  Filled 2022-08-20: qty 5

## 2022-08-20 MED ORDER — PROPOFOL 10 MG/ML IV BOLUS
INTRAVENOUS | Status: AC
  Filled 2022-08-20: qty 20

## 2022-08-20 MED ORDER — PHENYLEPHRINE 80 MCG/ML (10ML) SYRINGE FOR IV PUSH (FOR BLOOD PRESSURE SUPPORT)
PREFILLED_SYRINGE | INTRAVENOUS | Status: AC
  Filled 2022-08-20: qty 10

## 2022-08-20 MED ORDER — CHLORHEXIDINE GLUCONATE CLOTH 2 % EX PADS
6.0000 | MEDICATED_PAD | Freq: Every day | CUTANEOUS | Status: DC
  Administered 2022-08-22 – 2022-08-25 (×4): 6 via TOPICAL

## 2022-08-20 MED ORDER — LIDOCAINE 2% (20 MG/ML) 5 ML SYRINGE
INTRAMUSCULAR | Status: DC | PRN
  Administered 2022-08-20: 100 mg via INTRAVENOUS

## 2022-08-20 MED ORDER — THROMBIN 5000 UNITS EX SOLR
CUTANEOUS | Status: AC
  Filled 2022-08-20: qty 5000

## 2022-08-20 MED ORDER — AMLODIPINE BESYLATE 5 MG PO TABS
5.0000 mg | ORAL_TABLET | Freq: Every day | ORAL | Status: DC
  Administered 2022-08-20 – 2022-08-25 (×6): 5 mg via ORAL
  Filled 2022-08-20 (×6): qty 1

## 2022-08-20 MED ORDER — ACETAMINOPHEN 10 MG/ML IV SOLN
INTRAVENOUS | Status: DC | PRN
  Administered 2022-08-20: 1000 mg via INTRAVENOUS

## 2022-08-20 MED ORDER — ORAL CARE MOUTH RINSE: 15.0000 mL | Freq: Once | OROMUCOSAL | Status: AC

## 2022-08-20 MED ORDER — METHOCARBAMOL 500 MG PO TABS
1000.0000 mg | ORAL_TABLET | Freq: Three times a day (TID) | ORAL | Status: DC
  Administered 2022-08-21 – 2022-08-25 (×14): 1000 mg via ORAL
  Filled 2022-08-20 (×15): qty 2

## 2022-08-20 MED ORDER — HYDRALAZINE HCL 20 MG/ML IJ SOLN
INTRAMUSCULAR | Status: AC
  Filled 2022-08-20: qty 1

## 2022-08-20 MED ORDER — POTASSIUM CHLORIDE CRYS ER 20 MEQ PO TBCR
40.0000 meq | EXTENDED_RELEASE_TABLET | Freq: Every day | ORAL | Status: AC
  Administered 2022-08-20 – 2022-08-22 (×3): 40 meq via ORAL
  Filled 2022-08-20 (×3): qty 2

## 2022-08-20 MED ORDER — ACETAMINOPHEN 500 MG PO TABS
1000.0000 mg | ORAL_TABLET | Freq: Four times a day (QID) | ORAL | Status: DC
  Administered 2022-08-21 – 2022-08-25 (×16): 1000 mg via ORAL
  Filled 2022-08-20 (×21): qty 2

## 2022-08-20 MED ORDER — LIDOCAINE-EPINEPHRINE 1 %-1:100000 IJ SOLN
INTRAMUSCULAR | Status: AC
  Filled 2022-08-20: qty 1

## 2022-08-20 MED ORDER — DEXAMETHASONE SODIUM PHOSPHATE 10 MG/ML IJ SOLN
INTRAMUSCULAR | Status: DC | PRN
  Administered 2022-08-20: 10 mg via INTRAVENOUS

## 2022-08-20 MED ORDER — PHENYLEPHRINE HCL-NACL 20-0.9 MG/250ML-% IV SOLN
INTRAVENOUS | Status: DC | PRN
  Administered 2022-08-20: 40 ug/min via INTRAVENOUS

## 2022-08-20 MED ORDER — HYDRALAZINE HCL 20 MG/ML IJ SOLN
10.0000 mg | INTRAMUSCULAR | Status: DC | PRN
  Administered 2022-08-20 – 2022-08-25 (×8): 10 mg via INTRAVENOUS
  Filled 2022-08-20 (×7): qty 1

## 2022-08-20 MED ORDER — MIDAZOLAM HCL 5 MG/5ML IJ SOLN
INTRAMUSCULAR | Status: DC | PRN
  Administered 2022-08-20: 2 mg via INTRAVENOUS

## 2022-08-20 MED ORDER — ONDANSETRON HCL 4 MG/2ML IJ SOLN
INTRAMUSCULAR | Status: DC | PRN
  Administered 2022-08-20: 4 mg via INTRAVENOUS

## 2022-08-20 MED ORDER — SODIUM CHLORIDE 0.9 % IV BOLUS
1000.0000 mL | Freq: Once | INTRAVENOUS | Status: AC
  Administered 2022-08-20: 1000 mL via INTRAVENOUS

## 2022-08-20 MED ORDER — SUCCINYLCHOLINE CHLORIDE 200 MG/10ML IV SOSY
PREFILLED_SYRINGE | INTRAVENOUS | Status: AC
  Filled 2022-08-20: qty 10

## 2022-08-20 MED ORDER — SUCCINYLCHOLINE CHLORIDE 200 MG/10ML IV SOSY
PREFILLED_SYRINGE | INTRAVENOUS | Status: DC | PRN
  Administered 2022-08-20: 80 mg via INTRAVENOUS

## 2022-08-20 MED ORDER — 0.9 % SODIUM CHLORIDE (POUR BTL) OPTIME
TOPICAL | Status: DC | PRN
  Administered 2022-08-20: 1000 mL

## 2022-08-20 MED ORDER — MIDAZOLAM HCL 2 MG/2ML IJ SOLN
INTRAMUSCULAR | Status: AC
  Filled 2022-08-20: qty 2

## 2022-08-20 MED ORDER — LACTATED RINGERS IV SOLN: INTRAVENOUS | Status: DC

## 2022-08-20 MED ORDER — PROPOFOL 10 MG/ML IV BOLUS
INTRAVENOUS | Status: DC | PRN
  Administered 2022-08-20: 140 mg via INTRAVENOUS

## 2022-08-20 MED ORDER — PHENYLEPHRINE 80 MCG/ML (10ML) SYRINGE FOR IV PUSH (FOR BLOOD PRESSURE SUPPORT)
PREFILLED_SYRINGE | INTRAVENOUS | Status: DC | PRN
  Administered 2022-08-20: 240 ug via INTRAVENOUS
  Administered 2022-08-20: 40 ug via INTRAVENOUS

## 2022-08-20 SURGICAL SUPPLY — 54 items
ADH SKN CLS APL DERMABOND .7 (GAUZE/BANDAGES/DRESSINGS) ×1
APL SKNCLS STERI-STRIP NONHPOA (GAUZE/BANDAGES/DRESSINGS)
BAG COUNTER SPONGE SURGICOUNT (BAG) ×1 IMPLANT
BAG SPNG CNTER NS LX DISP (BAG) ×1
BAND INSRT 18 STRL LF DISP RB (MISCELLANEOUS) ×2
BAND RUBBER #18 3X1/16 STRL (MISCELLANEOUS) ×2 IMPLANT
BENZOIN TINCTURE PRP APPL 2/3 (GAUZE/BANDAGES/DRESSINGS) IMPLANT
BLADE CLIPPER SURG (BLADE) IMPLANT
BLADE SURG 11 STRL SS (BLADE) ×1 IMPLANT
BUR MATCHSTICK NEURO 3.0 LAGG (BURR) ×1 IMPLANT
CANISTER SUCT 3000ML PPV (MISCELLANEOUS) ×1 IMPLANT
DERMABOND ADVANCED .7 DNX12 (GAUZE/BANDAGES/DRESSINGS) ×1 IMPLANT
DRAPE C-ARM 42X72 X-RAY (DRAPES) ×2 IMPLANT
DRAPE HALF SHEET 40X57 (DRAPES) IMPLANT
DRAPE LAPAROTOMY 100X72 PEDS (DRAPES) ×1 IMPLANT
DRAPE MICROSCOPE SLANT 54X150 (MISCELLANEOUS) ×1 IMPLANT
DURAPREP 6ML APPLICATOR 50/CS (WOUND CARE) ×1 IMPLANT
ELECT COATED BLADE 2.86 ST (ELECTRODE) ×1 IMPLANT
ELECT REM PT RETURN 9FT ADLT (ELECTROSURGICAL) ×1
ELECTRODE REM PT RTRN 9FT ADLT (ELECTROSURGICAL) ×1 IMPLANT
GAUZE 4X4 16PLY ~~LOC~~+RFID DBL (SPONGE) IMPLANT
GLOVE BIOGEL PI IND STRL 7.5 (GLOVE) ×2 IMPLANT
GLOVE ECLIPSE 7.5 STRL STRAW (GLOVE) ×1 IMPLANT
GLOVE EXAM NITRILE LRG STRL (GLOVE) IMPLANT
GLOVE EXAM NITRILE XL STR (GLOVE) IMPLANT
GLOVE EXAM NITRILE XS STR PU (GLOVE) IMPLANT
GOWN STRL REUS W/ TWL LRG LVL3 (GOWN DISPOSABLE) ×2 IMPLANT
GOWN STRL REUS W/ TWL XL LVL3 (GOWN DISPOSABLE) IMPLANT
GOWN STRL REUS W/TWL 2XL LVL3 (GOWN DISPOSABLE) IMPLANT
GOWN STRL REUS W/TWL LRG LVL3 (GOWN DISPOSABLE) ×2
GOWN STRL REUS W/TWL XL LVL3 (GOWN DISPOSABLE)
HEMOSTAT POWDER KIT SURGIFOAM (HEMOSTASIS) ×1 IMPLANT
KIT BASIN OR (CUSTOM PROCEDURE TRAY) ×1 IMPLANT
KIT TURNOVER KIT B (KITS) ×1 IMPLANT
NDL SPNL 18GX3.5 QUINCKE PK (NEEDLE) ×1 IMPLANT
NEEDLE HYPO 22GX1.5 SAFETY (NEEDLE) ×1 IMPLANT
NEEDLE SPNL 18GX3.5 QUINCKE PK (NEEDLE) ×1 IMPLANT
NS IRRIG 1000ML POUR BTL (IV SOLUTION) ×1 IMPLANT
PACK LAMINECTOMY NEURO (CUSTOM PROCEDURE TRAY) ×1 IMPLANT
PAD ARMBOARD 7.5X6 YLW CONV (MISCELLANEOUS) ×3 IMPLANT
PIN DISTRACTION 14MM (PIN) IMPLANT
PLATE ELITE 21MM (Plate) IMPLANT
SCREW SELF TAP VAR 4.0X13 (Screw) IMPLANT
SPACER BONE CORNERSTONE 7X14 (Orthopedic Implant) IMPLANT
SPIKE FLUID TRANSFER (MISCELLANEOUS) ×1 IMPLANT
SPONGE INTESTINAL PEANUT (DISPOSABLE) ×1 IMPLANT
SPONGE SURGIFOAM ABS GEL SZ50 (HEMOSTASIS) IMPLANT
STAPLER VISISTAT 35W (STAPLE) IMPLANT
SUT MNCRL AB 3-0 PS2 18 (SUTURE) ×1 IMPLANT
SUT VIC AB 3-0 SH 8-18 (SUTURE) ×1 IMPLANT
TAPE CLOTH 3X10 TAN LF (GAUZE/BANDAGES/DRESSINGS) ×1 IMPLANT
TOWEL GREEN STERILE (TOWEL DISPOSABLE) ×1 IMPLANT
TOWEL GREEN STERILE FF (TOWEL DISPOSABLE) ×1 IMPLANT
WATER STERILE IRR 1000ML POUR (IV SOLUTION) ×1 IMPLANT
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
## 2022-08-20 NOTE — Transfer of Care (Signed)
Immediate Anesthesia Transfer of Care Note  Patient: Jessica Andrade  Procedure(s) Performed: C TWO -THREE ANTERIOR CERVICAL DECOMPRESSION/DISCECTOMY FUSION 1 LEVEL  Patient Location: PACU  Anesthesia Type:General  Level of Consciousness: drowsy  Airway & Oxygen Therapy: Patient Spontanous Breathing and Patient connected to face mask oxygen  Post-op Assessment: Report given to RN and Post -op Vital signs reviewed and stable  Post vital signs: Reviewed and stable  Last Vitals:  Vitals Value Taken Time  BP 126/62 08/20/22 1709  Temp    Pulse 76 08/20/22 1711  Resp 9 08/20/22 1711  SpO2 97 % 08/20/22 1711  Vitals shown include unvalidated device data.  Last Pain:  Vitals:   08/20/22 1200  TempSrc: Axillary  PainSc: Asleep         Complications: No notable events documented.
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
## 2022-08-20 NOTE — Anesthesia Procedure Notes (Addendum)
Arterial Line Insertion Start/End9/14/2023 2:40 PM, 08/20/2022 2:40 PM Performed by: CRNA  Patient location: Pre-op. Preanesthetic checklist: patient identified, IV checked, site marked, risks and benefits discussed, surgical consent, monitors and equipment checked, pre-op evaluation, timeout performed and anesthesia consent Lidocaine 1% used for infiltration Right, radial was placed Catheter size: 20 G Hand hygiene performed  and maximum sterile barriers used   Attempts: 1 Procedure performed without using ultrasound guided technique. Following insertion, dressing applied and Biopatch. Post procedure assessment: normal
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
## 2022-08-21 LAB — BASIC METABOLIC PANEL
Anion gap: 8 (ref 5–15)
BUN: 16 mg/dL (ref 6–20)
CO2: 23 mmol/L (ref 22–32)
Calcium: 8.2 mg/dL — ABNORMAL LOW (ref 8.9–10.3)
Chloride: 109 mmol/L (ref 98–111)
Creatinine, Ser: 0.74 mg/dL (ref 0.44–1.00)
GFR, Estimated: >60 mL/min (ref >60)
Glucose, Bld: 150 mg/dL — ABNORMAL HIGH (ref 70–99)
Potassium: 3.5 mmol/L (ref 3.5–5.1)
Sodium: 140 mmol/L (ref 135–145)

## 2022-08-21 LAB — GLUCOSE, CAPILLARY
Glucose-Capillary: 146 mg/dL — ABNORMAL HIGH (ref 70–99)
Glucose-Capillary: 138 mg/dL — ABNORMAL HIGH (ref 70–99)
Glucose-Capillary: 131 mg/dL — ABNORMAL HIGH (ref 70–99)
Glucose-Capillary: 116 mg/dL — ABNORMAL HIGH (ref 70–99)

## 2022-08-21 LAB — MAGNESIUM: Magnesium: 1.7 mg/dL (ref 1.7–2.4)

## 2022-08-21 MED ORDER — MAGNESIUM SULFATE 2 GM/50ML IV SOLN
2.0000 g | Freq: Once | INTRAVENOUS | Status: AC
  Administered 2022-08-21: 2 g via INTRAVENOUS
  Filled 2022-08-21: qty 50

## 2022-08-21 MED ORDER — LEVETIRACETAM 500 MG PO TABS
500.0000 mg | ORAL_TABLET | Freq: Two times a day (BID) | ORAL | Status: DC
  Administered 2022-08-21 – 2022-08-25 (×9): 500 mg via ORAL
  Filled 2022-08-21 (×10): qty 1

## 2022-08-21 MED ORDER — HYDROCHLOROTHIAZIDE 12.5 MG PO TABS
12.5000 mg | ORAL_TABLET | Freq: Every day | ORAL | Status: DC
  Administered 2022-08-21 – 2022-08-25 (×5): 12.5 mg via ORAL
  Filled 2022-08-21 (×5): qty 1
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
## 2022-08-21 NOTE — Anesthesia Postprocedure Evaluation (Signed)
Anesthesia Post Note  Patient: SYNAI PRETTYMAN  Procedure(s) Performed: C TWO -THREE ANTERIOR CERVICAL DECOMPRESSION/DISCECTOMY FUSION 1 LEVEL     Patient location during evaluation: PACU Anesthesia Type: General Level of consciousness: awake and alert Pain management: pain level controlled Vital Signs Assessment: post-procedure vital signs reviewed and stable Respiratory status: spontaneous breathing, nonlabored ventilation and respiratory function stable Cardiovascular status: blood pressure returned to baseline and stable Postop Assessment: no apparent nausea or vomiting Anesthetic complications: no   No notable events documented.  Last Vitals:  Vitals:   08/21/22 0615 08/21/22 0630  BP: 126/72 (!) 142/70  Pulse: 90 91  Resp: 14 13  Temp:    SpO2: 100% 100%    Last Pain:  Vitals:   08/21/22 0501  TempSrc:   PainSc: Asleep                 Lucretia Kern

## 2022-08-21 NOTE — Progress Notes (Signed)
Orthopedic Tech Progress Note Patient Details:  Jessica Andrade 01-05-1971 [id number]  Ortho Devices Type of Ortho Device: Thoracolumbar corset (TLSO) Ortho Device/Splint Interventions: Ordered, Adjustment      [name] A [name] 08/21/2022, 8:48 AM
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
## 2022-08-21 NOTE — TOC CAGE-AID Note (Signed)
Transition of Care [facility]) - CAGE-AID Screening   Patient Details  Name: Jessica Andrade MRN: [id number] Date of Birth: 02-Mar-1971  Transition of Care (TOC) CM/SW Contact:    Katha Hamming, RN Phone Number:[phone] 08/21/2022, 9:39 PM     CAGE-AID Screening:    Have You Ever Felt You Ought to Cut Down on Your Drinking or Drug Use?: No Have People Annoyed You By Critizing Your Drinking Or Drug Use?: No Have You Felt Bad Or Guilty About Your Drinking Or Drug Use?: No Have You Ever Had a Drink or Used Drugs First Thing In The Morning to Steady Your Nerves or to Get Rid of a Hangover?: No CAGE-AID Score: 0  Substance Abuse Education Offered: No
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
## 2022-08-22 ENCOUNTER — Encounter (HOSPITAL_COMMUNITY): Payer: Self-pay | Admitting: Neurological Surgery

## 2022-08-22 LAB — GLUCOSE, CAPILLARY
Glucose-Capillary: 105 mg/dL — ABNORMAL HIGH (ref 70–99)
Glucose-Capillary: 122 mg/dL — ABNORMAL HIGH (ref 70–99)
Glucose-Capillary: 122 mg/dL — ABNORMAL HIGH (ref 70–99)
Glucose-Capillary: 154 mg/dL — ABNORMAL HIGH (ref 70–99)
Glucose-Capillary: 158 mg/dL — ABNORMAL HIGH (ref 70–99)
Glucose-Capillary: 91 mg/dL (ref 70–99)

## 2022-08-22 MED ORDER — PANTOPRAZOLE SODIUM 40 MG PO TBEC
40.0000 mg | DELAYED_RELEASE_TABLET | Freq: Every day | ORAL | Status: DC
  Administered 2022-08-22 – 2022-08-25 (×4): 40 mg via ORAL
  Filled 2022-08-22 (×4): qty 1

## 2022-08-22 MED ORDER — LIDOCAINE 5 % EX PTCH
1.0000 | MEDICATED_PATCH | CUTANEOUS | Status: DC
  Administered 2022-08-22 – 2022-08-25 (×4): 1 via TRANSDERMAL
  Filled 2022-08-22 (×4): qty 1

## 2022-08-22 NOTE — Progress Notes (Signed)
  Transition of Care [facility]) Screening Note   Patient Details  Name: LINNET BOTTARI Date of Birth: 1971-02-03   Transition of Care [facility] CM/SW Contact:    Benard Halsted, LCSW Phone Number: 08/22/2022, 10:25 AM    Transition of Care Department [facility]) has reviewed patient and CIR currently assessing patient. We will continue to monitor patient advancement through interdisciplinary progression rounds. If new patient transition needs arise, please place a TOC consult.

## 2022-08-22 NOTE — Progress Notes (Signed)
Patient ID: Jessica Andrade, female   DOB: [birth year], 51 y.o.   MRN: [id number] Doing well, tolerating mobilization in TLSO, wound good, MAEx4, continue PT/OT

## 2022-08-22 NOTE — Progress Notes (Signed)
Inpatient Rehab Admissions Coordinator:  ? ?Per therapy recommendations,  patient was screened for CIR candidacy by Wrenly Lauritsen, MS, CCC-SLP. At this time, Pt. Appears to be a a potential candidate for CIR. I will place   order for rehab consult per protocol for full assessment. Please contact me any with questions. ? ?Tarynn Garling, MS, CCC-SLP ?Rehab Admissions Coordinator  ?[phone] (celll) ?[phone] (office) ? ?

## 2022-08-22 NOTE — Progress Notes (Signed)
Attempt made to transfer pt to bedside commode. Once pt sitting on side of bed she c/o increased dizziness and felt she would have a syncopal episode as she swayed backwards. Unable to safely obtain BP while sitting on side of bed. This RN safely laid pt back in bed with HOB at 30 degrees BP at this time 179/85 other VS WNL (see flowsheet). Dr. Redmond Pulling informed of episode instructed to give PRN hydralazine (see eMAR).
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
## 2022-08-23 LAB — CBC
HCT: 27.5 % — ABNORMAL LOW (ref 36.0–46.0)
Hemoglobin: 9.2 g/dL — ABNORMAL LOW (ref 12.0–15.0)
MCH: 28.6 pg (ref 26.0–34.0)
MCHC: 33.5 g/dL (ref 30.0–36.0)
MCV: 85.4 fL (ref 80.0–100.0)
Platelets: 152 10*3/uL (ref 150–400)
RBC: 3.22 MIL/uL — ABNORMAL LOW (ref 3.87–5.11)
RDW: 14.2 % (ref 11.5–15.5)
WBC: 7.4 10*3/uL (ref 4.0–10.5)
nRBC: 0 % (ref 0.0–0.2)

## 2022-08-23 LAB — GLUCOSE, CAPILLARY
Glucose-Capillary: 125 mg/dL — ABNORMAL HIGH (ref 70–99)
Glucose-Capillary: 137 mg/dL — ABNORMAL HIGH (ref 70–99)
Glucose-Capillary: 141 mg/dL — ABNORMAL HIGH (ref 70–99)
Glucose-Capillary: 166 mg/dL — ABNORMAL HIGH (ref 70–99)
Glucose-Capillary: 169 mg/dL — ABNORMAL HIGH (ref 70–99)
Glucose-Capillary: 170 mg/dL — ABNORMAL HIGH (ref 70–99)
Glucose-Capillary: 81 mg/dL (ref 70–99)

## 2022-08-23 LAB — BASIC METABOLIC PANEL
Anion gap: 6 (ref 5–15)
BUN: 15 mg/dL (ref 6–20)
CO2: 26 mmol/L (ref 22–32)
Calcium: 8.2 mg/dL — ABNORMAL LOW (ref 8.9–10.3)
Chloride: 110 mmol/L (ref 98–111)
Creatinine, Ser: 0.78 mg/dL (ref 0.44–1.00)
GFR, Estimated: >60 mL/min (ref >60)
Glucose, Bld: 124 mg/dL — ABNORMAL HIGH (ref 70–99)
Potassium: 3.2 mmol/L — ABNORMAL LOW (ref 3.5–5.1)
Sodium: 142 mmol/L (ref 135–145)

## 2022-08-23 MED ORDER — OXYCODONE HCL 5 MG PO TABS
2.5000 mg | ORAL_TABLET | ORAL | Status: DC | PRN
  Administered 2022-08-23 – 2022-08-25 (×4): 5 mg via ORAL
  Filled 2022-08-23 (×4): qty 1

## 2022-08-23 MED ORDER — HYDROCODONE-ACETAMINOPHEN 5-325 MG PO TABS: 1.0000 | ORAL_TABLET | ORAL | Status: DC | PRN

## 2022-08-23 MED ORDER — DM-GUAIFENESIN ER 30-600 MG PO TB12
1.0000 | ORAL_TABLET | Freq: Two times a day (BID) | ORAL | Status: DC
  Administered 2022-08-23 – 2022-08-25 (×5): 1 via ORAL
  Filled 2022-08-23 (×6): qty 1

## 2022-08-23 MED ORDER — POLYETHYLENE GLYCOL 3350 17 G PO PACK
17.0000 g | PACK | Freq: Every day | ORAL | Status: DC
  Administered 2022-08-23 – 2022-08-25 (×3): 17 g via ORAL
  Filled 2022-08-23 (×3): qty 1

## 2022-08-23 MED ORDER — POTASSIUM CHLORIDE 20 MEQ PO PACK
40.0000 meq | PACK | Freq: Four times a day (QID) | ORAL | Status: AC
  Administered 2022-08-23 (×2): 40 meq via ORAL
  Filled 2022-08-23 (×2): qty 2
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
## 2022-08-23 NOTE — Progress Notes (Signed)
NEUROSURGERY PROGRESS NOTE  Doing well. Complains of neck pain but no back pain. She gets up and ambulates to the bathroom in her brace with no pain. Continue efforts at mobilization. Family concerned about her lethargy, will change her pain medicine   Temp:  [97.6 F (36.4 C)-99 F (37.2 C)] 98 F (36.7 C) (09/17 0352) Pulse Rate:  [96-106] 106 (09/17 0742) Resp:  [14-22] 19 (09/17 0742) BP: (136-187)/(69-87) 171/74 (09/17 0742) SpO2:  [90 %-99 %] 94 % (09/17 0742)   Jessica Chiquito, NP 08/23/2022 8:30 AM

## 2022-08-24 LAB — HEMOGLOBIN A1C
Hgb A1c MFr Bld: 5.8 % — ABNORMAL HIGH (ref 4.8–5.6)
Mean Plasma Glucose: 119.76 mg/dL

## 2022-08-24 LAB — BASIC METABOLIC PANEL
Anion gap: 8 (ref 5–15)
BUN: 13 mg/dL (ref 6–20)
CO2: 27 mmol/L (ref 22–32)
Calcium: 8.6 mg/dL — ABNORMAL LOW (ref 8.9–10.3)
Chloride: 107 mmol/L (ref 98–111)
Creatinine, Ser: 0.66 mg/dL (ref 0.44–1.00)
GFR, Estimated: >60 mL/min (ref >60)
Glucose, Bld: 138 mg/dL — ABNORMAL HIGH (ref 70–99)
Potassium: 3.9 mmol/L (ref 3.5–5.1)
Sodium: 142 mmol/L (ref 135–145)

## 2022-08-24 LAB — CBC
HCT: 28.6 % — ABNORMAL LOW (ref 36.0–46.0)
Hemoglobin: 9.4 g/dL — ABNORMAL LOW (ref 12.0–15.0)
MCH: 28.6 pg (ref 26.0–34.0)
MCHC: 32.9 g/dL (ref 30.0–36.0)
MCV: 86.9 fL (ref 80.0–100.0)
Platelets: 182 10*3/uL (ref 150–400)
RBC: 3.29 MIL/uL — ABNORMAL LOW (ref 3.87–5.11)
RDW: 14.3 % (ref 11.5–15.5)
WBC: 6.7 10*3/uL (ref 4.0–10.5)
nRBC: 0.5 % — ABNORMAL HIGH (ref 0.0–0.2)

## 2022-08-24 LAB — MAGNESIUM: Magnesium: 1.8 mg/dL (ref 1.7–2.4)

## 2022-08-24 LAB — GLUCOSE, CAPILLARY
Glucose-Capillary: 120 mg/dL — ABNORMAL HIGH (ref 70–99)
Glucose-Capillary: 121 mg/dL — ABNORMAL HIGH (ref 70–99)
Glucose-Capillary: 124 mg/dL — ABNORMAL HIGH (ref 70–99)
Glucose-Capillary: 153 mg/dL — ABNORMAL HIGH (ref 70–99)
Glucose-Capillary: 116 mg/dL — ABNORMAL HIGH (ref 70–99)
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
## 2022-08-24 NOTE — Discharge Instructions (Signed)
Neurosurgery Discharge Instructions  Wear your back brace (TLSO) when out of bed, you don't have to wear it when you're in bed.   Your cervical / neck incision is closed with dermabond (purple glue). This will naturally fall off over the next 1-2 weeks.   Okay to shower on the day of discharge. Use regular soap and water and try to be gentle when cleaning your incision.   Follow up with Dr. Zada Finders in 2 weeks after discharge. If you do not already have a discharge appointment, please call his office at [phone] to schedule a follow up appointment. If you have any concerns or questions, please call the office and let us know.
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
## 2022-08-24 NOTE — Progress Notes (Signed)
Pt was praying with family during rounds this afternoon, asked not to be interrupted, weekend notes reviewed, ambulating well with TLSO, lumbar fracture therefore will not require operative intervention.  -Hangman's: s/p C2-3 ACDF, no collar needed -L3 burst frx: TLSO when out of bed, otherwise activity as tolerated -ok for DVT chemoprophylaxis from my standpoint -will place my discharge instructions in Epic discharge section
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
## 2022-08-25 ENCOUNTER — Inpatient Hospital Stay (HOSPITAL_COMMUNITY)
Admission: RE | Admit: 2022-08-25 | Discharge: 2022-09-02 | DRG: 945 | Disposition: A | Discharge status: Home / Self Care | Payer: Commercial Managed Care - HMO | Source: Intra-hospital | Attending: Physical Medicine & Rehabilitation | Admitting: Physical Medicine & Rehabilitation

## 2022-08-25 ENCOUNTER — Encounter (HOSPITAL_COMMUNITY): Payer: Self-pay | Admitting: Physical Medicine & Rehabilitation

## 2022-08-25 ENCOUNTER — Encounter: Payer: Self-pay | Admitting: Family Medicine

## 2022-08-25 ENCOUNTER — Other Ambulatory Visit: Payer: Self-pay

## 2022-08-25 DIAGNOSIS — D62 Acute posthemorrhagic anemia: Secondary | ICD-10-CM | POA: Diagnosis present

## 2022-08-25 DIAGNOSIS — S22009A Unspecified fracture of unspecified thoracic vertebra, initial encounter for closed fracture: Secondary | ICD-10-CM | POA: Diagnosis present

## 2022-08-25 DIAGNOSIS — E876 Hypokalemia: Secondary | ICD-10-CM | POA: Diagnosis present

## 2022-08-25 DIAGNOSIS — S12130S Unspecified traumatic displaced spondylolisthesis of second cervical vertebra, sequela: Secondary | ICD-10-CM

## 2022-08-25 DIAGNOSIS — S2242XS Multiple fractures of ribs, left side, sequela: Secondary | ICD-10-CM | POA: Diagnosis not present

## 2022-08-25 DIAGNOSIS — S12290D Other displaced fracture of third cervical vertebra, subsequent encounter for fracture with routine healing: Secondary | ICD-10-CM

## 2022-08-25 DIAGNOSIS — S12100S Unspecified displaced fracture of second cervical vertebra, sequela: Secondary | ICD-10-CM | POA: Diagnosis not present

## 2022-08-25 DIAGNOSIS — Z8249 Family history of ischemic heart disease and other diseases of the circulatory system: Secondary | ICD-10-CM

## 2022-08-25 DIAGNOSIS — T1490XA Injury, unspecified, initial encounter: Secondary | ICD-10-CM | POA: Diagnosis not present

## 2022-08-25 DIAGNOSIS — S066XAD Traumatic subarachnoid hemorrhage with loss of consciousness status unknown, subsequent encounter: Secondary | ICD-10-CM | POA: Diagnosis present

## 2022-08-25 DIAGNOSIS — R7303 Prediabetes: Secondary | ICD-10-CM | POA: Diagnosis present

## 2022-08-25 DIAGNOSIS — S2220XD Unspecified fracture of sternum, subsequent encounter for fracture with routine healing: Secondary | ICD-10-CM | POA: Diagnosis not present

## 2022-08-25 DIAGNOSIS — I609 Nontraumatic subarachnoid hemorrhage, unspecified: Secondary | ICD-10-CM

## 2022-08-25 DIAGNOSIS — Z79899 Other long term (current) drug therapy: Secondary | ICD-10-CM

## 2022-08-25 DIAGNOSIS — S12190D Other displaced fracture of second cervical vertebra, subsequent encounter for fracture with routine healing: Secondary | ICD-10-CM | POA: Diagnosis not present

## 2022-08-25 DIAGNOSIS — R7989 Other specified abnormal findings of blood chemistry: Secondary | ICD-10-CM | POA: Diagnosis not present

## 2022-08-25 DIAGNOSIS — S27322D Contusion of lung, bilateral, subsequent encounter: Secondary | ICD-10-CM | POA: Diagnosis not present

## 2022-08-25 DIAGNOSIS — S2242XD Multiple fractures of ribs, left side, subsequent encounter for fracture with routine healing: Secondary | ICD-10-CM | POA: Diagnosis not present

## 2022-08-25 DIAGNOSIS — I10 Essential (primary) hypertension: Secondary | ICD-10-CM | POA: Diagnosis present

## 2022-08-25 DIAGNOSIS — S2220XA Unspecified fracture of sternum, initial encounter for closed fracture: Secondary | ICD-10-CM | POA: Diagnosis present

## 2022-08-25 DIAGNOSIS — S32031D Stable burst fracture of third lumbar vertebra, subsequent encounter for fracture with routine healing: Secondary | ICD-10-CM | POA: Diagnosis not present

## 2022-08-25 DIAGNOSIS — E7800 Pure hypercholesterolemia, unspecified: Secondary | ICD-10-CM | POA: Diagnosis present

## 2022-08-25 LAB — GLUCOSE, CAPILLARY
Glucose-Capillary: 132 mg/dL — ABNORMAL HIGH (ref 70–99)
Glucose-Capillary: 151 mg/dL — ABNORMAL HIGH (ref 70–99)
Glucose-Capillary: 105 mg/dL — ABNORMAL HIGH (ref 70–99)
Glucose-Capillary: 192 mg/dL — ABNORMAL HIGH (ref 70–99)

## 2022-08-25 LAB — BASIC METABOLIC PANEL
Anion gap: 7 (ref 5–15)
BUN: 10 mg/dL (ref 6–20)
CO2: 28 mmol/L (ref 22–32)
Calcium: 8.8 mg/dL — ABNORMAL LOW (ref 8.9–10.3)
Chloride: 104 mmol/L (ref 98–111)
Creatinine, Ser: 0.7 mg/dL (ref 0.44–1.00)
GFR, Estimated: >60 mL/min (ref >60)
Glucose, Bld: 142 mg/dL — ABNORMAL HIGH (ref 70–99)
Potassium: 3.5 mmol/L (ref 3.5–5.1)
Sodium: 139 mmol/L (ref 135–145)

## 2022-08-25 MED ORDER — FLEET ENEMA 7-19 GM/118ML RE ENEM: 1.0000 | ENEMA | Freq: Once | RECTAL | Status: DC | PRN

## 2022-08-25 MED ORDER — POLYETHYLENE GLYCOL 3350 17 G PO PACK: 17.0000 g | PACK | Freq: Every day | ORAL | Status: DC | PRN

## 2022-08-25 MED ORDER — ACETAMINOPHEN 325 MG PO TABS
650.0000 mg | ORAL_TABLET | Freq: Four times a day (QID) | ORAL | Status: DC
  Administered 2022-08-25 – 2022-08-28 (×11): 650 mg via ORAL
  Filled 2022-08-25 (×12): qty 2

## 2022-08-25 MED ORDER — PANTOPRAZOLE SODIUM 40 MG PO TBEC
40.0000 mg | DELAYED_RELEASE_TABLET | Freq: Every day | ORAL | Status: DC
  Administered 2022-08-26 – 2022-09-02 (×8): 40 mg via ORAL
  Filled 2022-08-25 (×8): qty 1

## 2022-08-25 MED ORDER — TRAMADOL HCL 50 MG PO TABS
50.0000 mg | ORAL_TABLET | Freq: Four times a day (QID) | ORAL | Status: DC | PRN
  Administered 2022-08-29 – 2022-08-31 (×2): 50 mg via ORAL
  Filled 2022-08-25 (×2): qty 1

## 2022-08-25 MED ORDER — MELATONIN 3 MG PO TABS: 3.0000 mg | ORAL_TABLET | Freq: Every evening | ORAL | Status: DC | PRN

## 2022-08-25 MED ORDER — DIPHENHYDRAMINE HCL 12.5 MG/5ML PO ELIX: 12.5000 mg | ORAL_SOLUTION | Freq: Four times a day (QID) | ORAL | Status: DC | PRN

## 2022-08-25 MED ORDER — HYDRALAZINE HCL 10 MG PO TABS: 10.0000 mg | ORAL_TABLET | Freq: Four times a day (QID) | ORAL | Status: DC | PRN

## 2022-08-25 MED ORDER — PROCHLORPERAZINE 25 MG RE SUPP: 12.5000 mg | Freq: Four times a day (QID) | RECTAL | Status: DC | PRN

## 2022-08-25 MED ORDER — METHOCARBAMOL 500 MG PO TABS
1000.0000 mg | ORAL_TABLET | Freq: Three times a day (TID) | ORAL | Status: DC
  Administered 2022-08-25 – 2022-09-02 (×23): 1000 mg via ORAL
  Filled 2022-08-25 (×23): qty 2

## 2022-08-25 MED ORDER — LEVETIRACETAM 500 MG PO TABS
500.0000 mg | ORAL_TABLET | Freq: Two times a day (BID) | ORAL | Status: AC
  Administered 2022-08-25 – 2022-08-26 (×3): 500 mg via ORAL
  Filled 2022-08-25 (×3): qty 1

## 2022-08-25 MED ORDER — ENOXAPARIN SODIUM 30 MG/0.3ML IJ SOSY
30.0000 mg | PREFILLED_SYRINGE | Freq: Two times a day (BID) | INTRAMUSCULAR | Status: DC
  Administered 2022-08-25 – 2022-09-02 (×16): 30 mg via SUBCUTANEOUS
  Filled 2022-08-25 (×16): qty 0.3

## 2022-08-25 MED ORDER — PROCHLORPERAZINE EDISYLATE 10 MG/2ML IJ SOLN: 5.0000 mg | Freq: Four times a day (QID) | INTRAMUSCULAR | Status: DC | PRN

## 2022-08-25 MED ORDER — DM-GUAIFENESIN ER 30-600 MG PO TB12
1.0000 | ORAL_TABLET | Freq: Two times a day (BID) | ORAL | Status: DC
  Administered 2022-08-25 – 2022-09-02 (×16): 1 via ORAL
  Filled 2022-08-25 (×16): qty 1

## 2022-08-25 MED ORDER — BISACODYL 10 MG RE SUPP: 10.0000 mg | Freq: Every day | RECTAL | Status: DC | PRN

## 2022-08-25 MED ORDER — POLYETHYLENE GLYCOL 3350 17 G PO PACK
17.0000 g | PACK | Freq: Every day | ORAL | Status: DC
  Administered 2022-08-26 – 2022-09-01 (×7): 17 g via ORAL
  Filled 2022-08-25 (×7): qty 1

## 2022-08-25 MED ORDER — HYDROCHLOROTHIAZIDE 12.5 MG PO TABS
12.5000 mg | ORAL_TABLET | Freq: Every day | ORAL | Status: DC
  Administered 2022-08-26 – 2022-08-27 (×2): 12.5 mg via ORAL
  Filled 2022-08-25 (×2): qty 1

## 2022-08-25 MED ORDER — PROCHLORPERAZINE MALEATE 5 MG PO TABS: 5.0000 mg | ORAL_TABLET | Freq: Four times a day (QID) | ORAL | Status: DC | PRN

## 2022-08-25 MED ORDER — INSULIN ASPART 100 UNIT/ML IJ SOLN: 0.0000 [IU] | Freq: Every day | INTRAMUSCULAR | Status: DC

## 2022-08-25 MED ORDER — LIDOCAINE 5 % EX PTCH
1.0000 | MEDICATED_PATCH | CUTANEOUS | Status: DC
  Administered 2022-08-26 – 2022-08-29 (×4): 1 via TRANSDERMAL
  Filled 2022-08-25 (×4): qty 1

## 2022-08-25 MED ORDER — AMLODIPINE BESYLATE 10 MG PO TABS: 10.0000 mg | ORAL_TABLET | Freq: Every day | ORAL | Status: DC

## 2022-08-25 MED ORDER — AMLODIPINE BESYLATE 10 MG PO TABS
10.0000 mg | ORAL_TABLET | Freq: Every day | ORAL | Status: DC
  Administered 2022-08-26 – 2022-09-02 (×8): 10 mg via ORAL
  Filled 2022-08-25 (×8): qty 1

## 2022-08-25 MED ORDER — GUAIFENESIN-DM 100-10 MG/5ML PO SYRP: 5.0000 mL | ORAL_SOLUTION | Freq: Four times a day (QID) | ORAL | Status: DC | PRN

## 2022-08-25 MED ORDER — INSULIN ASPART 100 UNIT/ML IJ SOLN
0.0000 [IU] | Freq: Three times a day (TID) | INTRAMUSCULAR | Status: DC
  Administered 2022-08-26 – 2022-08-27 (×4): 1 [IU] via SUBCUTANEOUS
  Administered 2022-08-27: 2 [IU] via SUBCUTANEOUS
  Administered 2022-08-28: 1 [IU] via SUBCUTANEOUS
  Administered 2022-08-29: 2 [IU] via SUBCUTANEOUS
  Administered 2022-08-29: 1 [IU] via SUBCUTANEOUS
  Administered 2022-08-29: 3 [IU] via SUBCUTANEOUS
  Administered 2022-08-31 (×2): 1 [IU] via SUBCUTANEOUS
  Administered 2022-08-31: 2 [IU] via SUBCUTANEOUS
  Administered 2022-09-01: 1 [IU] via SUBCUTANEOUS

## 2022-08-25 MED ORDER — ALUM & MAG HYDROXIDE-SIMETH 200-200-20 MG/5ML PO SUSP: 30.0000 mL | ORAL | Status: DC | PRN

## 2022-08-25 NOTE — TOC Transition Note (Signed)
Transition of Care [facility]) - CM/SW Discharge Note   Patient Details  Name: NICHELLE RENWICK MRN: [id number] Date of Birth: [birth year]  Transition of Care [facility]) CM/SW Contact:  Ella Bodo, RN Phone Number: 08/25/2022, 1:55 PM   Clinical Narrative:    Patient medically stable for discharge, and insurance authorization has been received for admission to [facility] today.  Plan transfer to rehab unit when bed ready.    Final next level of care: IP Rehab Facility Barriers to Discharge: Barriers Resolved   Patient Goals and CMS Choice   CMS Medicare.gov Compare Post Acute Care list provided to:: Patient Choice offered to / list presented to : Patient                        Discharge Plan and Services   Discharge Planning Services: CM Consult Post Acute Care Choice: IP Rehab                               Social Determinants of Health (SDOH) Interventions     Readmission Risk Interventions     No data to display         Reinaldo Raddle, RN, BSN  Trauma/Neuro ICU Case Manager [phone]

## 2022-08-25 NOTE — Progress Notes (Signed)
SLP Cancellation Note  Patient Details Name: SHANEKA EFAW MRN: [id number] DOB: [birth year]   Cancelled treatment:        Attempted to see pt for cognitive-linguistic assessment.  Pt just beginning PT session at time of attempt.  SLP will return as schedule permits.   Celedonio Savage, [place], [place] Office: [phone] 08/25/2022, 11:21 AM
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
## 2022-08-25 NOTE — Progress Notes (Signed)
Inpatient Rehab Admissions Coordinator:    I have a CIR bed for this Pt. Today, RN may call report to [phone].  Clemens Catholic, [place], [place] Admissions Coordinator  [phone] ([place]) [phone] (office)
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
## 2022-08-25 NOTE — Progress Notes (Signed)
Report called to CIR [id number]. Patient with no complaints at the current time. Will transfer via [place].
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
## 2022-08-25 NOTE — Progress Notes (Addendum)
Inpatient Rehabilitation Admission Medication Review by a Pharmacist  A complete drug regimen review was completed for this patient to identify any potential clinically significant medication issues.  High Risk Drug Classes Is patient taking? Indication by Medication  Antipsychotic Yes Compazine IM/PO - N/v  Anticoagulant Yes Lovenox - DVT px  Antibiotic No   Opioid Yes Tramadol - pain  Antiplatelet No   Hypoglycemics/insulin Yes SSI - DM  Vasoactive Medication Yes HCTZ/amlodipine - HTN  Chemotherapy No   Other Yes Keppra - sz prophylaxis Lidocaine patch - pain Robaxin - spasms Protonix - GERD Mucinex - cough     Type of Medication Issue Identified Description of Issue Recommendation(s)  Drug Interaction(s) (clinically significant)     Duplicate Therapy     Allergy     No Medication Administration End Date     Incorrect Dose     Additional Drug Therapy Needed     Significant med changes from prior encounter (inform family/care partners about these prior to discharge).    Other       Clinically significant medication issues were identified that warrant physician communication and completion of prescribed/recommended actions by midnight of the next day:  No  Name of provider notified for urgent issues identified:   Provider Method of Notification:     Pharmacist comments:   Time spent performing this drug regimen review (minutes):  [place], PharmD, [place], AAHIVP, CPP Infectious Disease Pharmacist 08/25/2022 5:41 PM
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
## 2022-08-26 DIAGNOSIS — E876 Hypokalemia: Secondary | ICD-10-CM

## 2022-08-26 LAB — CBC WITH DIFFERENTIAL/PLATELET
Abs Immature Granulocytes: 0.39 10*3/uL — ABNORMAL HIGH (ref 0.00–0.07)
Basophils Absolute: 0.1 10*3/uL (ref 0.0–0.1)
Basophils Relative: 1 %
Eosinophils Absolute: 0.3 10*3/uL (ref 0.0–0.5)
Eosinophils Relative: 5 %
HCT: 29.4 % — ABNORMAL LOW (ref 36.0–46.0)
Hemoglobin: 10 g/dL — ABNORMAL LOW (ref 12.0–15.0)
Immature Granulocytes: 5 %
Lymphocytes Relative: 20 %
Lymphs Abs: 1.5 10*3/uL (ref 0.7–4.0)
MCH: 29.1 pg (ref 26.0–34.0)
MCHC: 34 g/dL (ref 30.0–36.0)
MCV: 85.5 fL (ref 80.0–100.0)
Monocytes Absolute: 0.5 10*3/uL (ref 0.1–1.0)
Monocytes Relative: 7 %
Neutro Abs: 4.8 10*3/uL (ref 1.7–7.7)
Neutrophils Relative %: 62 %
Platelets: 218 10*3/uL (ref 150–400)
RBC: 3.44 MIL/uL — ABNORMAL LOW (ref 3.87–5.11)
RDW: 14.8 % (ref 11.5–15.5)
WBC: 7.6 10*3/uL (ref 4.0–10.5)
nRBC: 0.8 % — ABNORMAL HIGH (ref 0.0–0.2)

## 2022-08-26 LAB — COMPREHENSIVE METABOLIC PANEL
ALT: 75 U/L — ABNORMAL HIGH (ref 0–44)
AST: 93 U/L — ABNORMAL HIGH (ref 15–41)
Albumin: 2.9 g/dL — ABNORMAL LOW (ref 3.5–5.0)
Alkaline Phosphatase: 61 U/L (ref 38–126)
Anion gap: 10 (ref 5–15)
BUN: 13 mg/dL (ref 6–20)
CO2: 28 mmol/L (ref 22–32)
Calcium: 8.9 mg/dL (ref 8.9–10.3)
Chloride: 101 mmol/L (ref 98–111)
Creatinine, Ser: 0.65 mg/dL (ref 0.44–1.00)
GFR, Estimated: >60 mL/min (ref >60)
Glucose, Bld: 130 mg/dL — ABNORMAL HIGH (ref 70–99)
Potassium: 3.1 mmol/L — ABNORMAL LOW (ref 3.5–5.1)
Sodium: 139 mmol/L (ref 135–145)
Total Bilirubin: 1.4 mg/dL — ABNORMAL HIGH (ref 0.3–1.2)
Total Protein: 6.1 g/dL — ABNORMAL LOW (ref 6.5–8.1)

## 2022-08-26 LAB — GLUCOSE, CAPILLARY
Glucose-Capillary: 123 mg/dL — ABNORMAL HIGH (ref 70–99)
Glucose-Capillary: 141 mg/dL — ABNORMAL HIGH (ref 70–99)
Glucose-Capillary: 141 mg/dL — ABNORMAL HIGH (ref 70–99)
Glucose-Capillary: 133 mg/dL — ABNORMAL HIGH (ref 70–99)

## 2022-08-26 MED ORDER — POTASSIUM CHLORIDE CRYS ER 20 MEQ PO TBCR
20.0000 meq | EXTENDED_RELEASE_TABLET | Freq: Two times a day (BID) | ORAL | Status: DC
  Administered 2022-08-26: 20 meq via ORAL
  Filled 2022-08-26: qty 1

## 2022-08-26 MED ORDER — METOPROLOL TARTRATE 12.5 MG HALF TABLET
12.5000 mg | ORAL_TABLET | Freq: Two times a day (BID) | ORAL | Status: DC
  Administered 2022-08-26 – 2022-08-27 (×3): 12.5 mg via ORAL
  Filled 2022-08-26 (×3): qty 1

## 2022-08-26 MED ORDER — POTASSIUM CHLORIDE 20 MEQ PO PACK
20.0000 meq | PACK | Freq: Two times a day (BID) | ORAL | Status: DC
  Administered 2022-08-26 – 2022-08-28 (×4): 20 meq via ORAL
  Filled 2022-08-26 (×4): qty 1

## 2022-08-26 MED ORDER — ORAL CARE MOUTH RINSE: 15.0000 mL | OROMUCOSAL | Status: DC | PRN

## 2022-08-26 NOTE — Progress Notes (Signed)
Slept well last night. SBP remains high. Medical team aware. Obtained prn orders from Night shift on-call for PO hydralazine with specified parameters. Day shift staff to be informed. Family relative stayed overnight. Safety ensured at all times.
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
## 2022-08-26 NOTE — Plan of Care (Signed)
  Problem: RH Memory Goal: LTG Patient will demonstrate ability for day to day (SLP) Description: LTG:   Patient will demonstrate ability for day to day recall/carryover during cognitive/linguistic activities with assist  (SLP) Flowsheets (Taken 08/26/2022 1210) LTG: Patient will demonstrate ability for day to day recall: Daily complex information LTG: Patient will demonstrate ability for day to day recall/carryover during cognitive/linguistic activities with assist (SLP): Supervision Goal: LTG Patient will use memory compensatory aids to (SLP) Description: LTG:  Patient will use memory compensatory aids to recall biographical/new, daily complex information with cues (SLP) Flowsheets (Taken 08/26/2022 1210) LTG: Patient will use memory compensatory aids to (SLP): Supervision   Problem: RH Attention Goal: LTG Patient will demonstrate this level of attention during functional activites (SLP) Description: LTG:  Patient will will demonstrate this level of attention during functional activites (SLP) Flowsheets (Taken 08/26/2022 1210) Patient will demonstrate during cognitive/linguistic activities the attention type of: Sustained Patient will demonstrate this level of attention during cognitive/linguistic activities in: Controlled LTG: Patient will demonstrate this level of attention during cognitive/linguistic activities with assistance of (SLP): Supervision Number of minutes patient will demonstrate attention during cognitive/linguistic activities: 10-15   Problem: RH Problem Solving Goal: LTG Patient will demonstrate problem solving for (SLP) Description: LTG:  Patient will demonstrate problem solving for basic/complex daily situations with cues  (SLP) Flowsheets (Taken 08/26/2022 1412) LTG: Patient will demonstrate problem solving for (SLP): (mildly complex /functional tasks) Other (comment) LTG Patient will demonstrate problem solving for: Supervision
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
## 2022-08-26 NOTE — Progress Notes (Signed)
Tried to contacting the pt 2x  to schedule appt. Pt did not answer.

## 2022-08-26 NOTE — Progress Notes (Addendum)
Blood pressures remain elevated--question pain component. Will add kdur bid for supplement as on HCTZ. Add low dose metoprolol to help with BP as well as HR control.
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
## 2022-08-26 NOTE — Progress Notes (Signed)
Inpatient Rehabilitation Center Individual Statement of Services  Patient Name:  Jessica Andrade  Date:  08/26/2022  Welcome to the [facility].  Our goal is to provide you with an individualized program based on your diagnosis and situation, designed to meet your specific needs.  With this comprehensive rehabilitation program, you will be expected to participate in at least 3 hours of rehabilitation therapies Monday-Friday, with modified therapy programming on the weekends.  Your rehabilitation program will include the following services:   Physical Therapy (PT), Occupational Therapy (OT), Speech Therapy (ST), 24 hour per day rehabilitation nursing, Neuropsychology, Care Coordinator, Rehabilitation Medicine, Nutrition Services, and Pharmacy Services Weekly team conferences will be held on Wednesday to discuss your progress.  Your Inpatient Rehabilitation Care Coordinator will talk with you frequently to get your input and to update you on team discussions.  Team conferences with you and your family in attendance may also be held.  Expected length of stay: 7 days  Overall anticipated outcome: Independent level  Depending on your progress and recovery, your program may change. Your Inpatient Rehabilitation Care Coordinator will coordinate services and will keep you informed of any changes. Your Inpatient Rehabilitation Care Coordinator's name and contact numbers are listed  below.  The following services may also be recommended but are not provided by the [place] will be made to provide these services after discharge if needed.  Arrangements include referral to agencies that provide these services.  Your insurance has been verified to be:  [place]  Your primary doctor is:  Donney Dice  Pertinent information will be  shared with your doctor and your insurance company.  Inpatient Rehabilitation Care Coordinator:  Ovidio Kin, [place] or Emilia Beck  Information discussed with and copy given to patient by: Elease Hashimoto, 08/26/2022, 9:41 AM
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
## 2022-08-26 NOTE — Progress Notes (Signed)
Inpatient Rehabilitation  Patient information reviewed and entered into eRehab system by Jaaziah Schulke Larance Ratledge, OTR/L, Rehab Quality Coordinator.   Information including medical coding, functional ability and quality indicators will be reviewed and updated through discharge.   

## 2022-08-27 DIAGNOSIS — R7989 Other specified abnormal findings of blood chemistry: Secondary | ICD-10-CM

## 2022-08-27 LAB — GLUCOSE, CAPILLARY
Glucose-Capillary: 135 mg/dL — ABNORMAL HIGH (ref 70–99)
Glucose-Capillary: 93 mg/dL (ref 70–99)
Glucose-Capillary: 169 mg/dL — ABNORMAL HIGH (ref 70–99)
Glucose-Capillary: 159 mg/dL — ABNORMAL HIGH (ref 70–99)

## 2022-08-27 MED ORDER — HYDROCHLOROTHIAZIDE 25 MG PO TABS: 25.0000 mg | ORAL_TABLET | Freq: Every day | ORAL | Status: DC

## 2022-08-27 MED ORDER — METOPROLOL TARTRATE 25 MG PO TABS
25.0000 mg | ORAL_TABLET | Freq: Two times a day (BID) | ORAL | Status: DC
  Administered 2022-08-27 – 2022-09-02 (×12): 25 mg via ORAL
  Filled 2022-08-27 (×12): qty 1

## 2022-08-27 MED ORDER — HYDROCHLOROTHIAZIDE 12.5 MG PO TABS
12.5000 mg | ORAL_TABLET | Freq: Every day | ORAL | Status: DC
  Administered 2022-08-28: 12.5 mg via ORAL
  Filled 2022-08-27: qty 1

## 2022-08-27 NOTE — Progress Notes (Signed)
Patient ID: Jessica Andrade, female   DOB: 05-22-1971, 51 y.o.   MRN: [id number]  Met with pt and her friends who were present in her room she gave worker permission to speak in front of them. Updated her regarding team conference goals of mod/I level and discharge date 9/27. She feels she is doing well and should be ready by next Wednesday. Will await equipment recommendations and work on discharge needs.

## 2022-08-27 NOTE — Progress Notes (Signed)
Slept well last night. No new changes to report.Safety ensured at all times.

## 2022-08-27 NOTE — Progress Notes (Signed)
Physical Therapy Session Note  Patient Details  Name: Jessica Andrade MRN: [id number] Date of Birth: 06-14-1971  Today's Date: 08/27/2022 PT Individual Time: 1400-1500 PT Individual Time Calculation (min): 60 min   Short Term Goals: Week 1:  PT Short Term Goal 1 (Week 1): STG=LTG due to ELOS  Skilled Therapeutic Interventions/Progress Updates:  Patient greeted sitting upright in wheelchair in room with family present and initially praying- After prayer, patient and family were agreeable to PT treatment session. Patient reporting she prays at [facility] and 1:30-2pm- Rehab scheduler notified in order to block patient's schedule to allot for prayer time. Therapist spent time at start of session adjusting patient's TLSO for improved fit.   Patient requesting to be taken to the rehab gym via wheelchair, however with educated and coaxing she was agreeable to ambulating- Patient gait trained x150', x160' without AD and CGA for safety. VC for not reaching out for railing or family's hand for support, as well as increased B foot clearance. With VC patient demonstrated good improvements.   Patient completed the [facility] Balance Assessment in order to assess fall risk. Patient demonstrates increased fall risk as noted by score of 42/56 on Berg Balance Scale.  (<36= high risk for falls, close to 100%; 37-45 significant >80%; 46-51 moderate >50%; 52-55 lower >25%). Patient was limited by spinal precautions for item regarding looking over each shoulder- Scored via clinician's judgement. Patient required frequent seated rest breaks throughout assessment. Please see below for detailed information regarding assessment.   Patient initially refusing to ambulate back to her room from rehab gym stating "I will sleep here" or joking and stating "I will borrow her chair" gesturing to another patient. With education and coaxing, patient was agreeable to ambulating x120' back to her room without AD and CGA. Patient continued to  require VC for improved B foot clearance and decreased shuffling.   Once in her room, patient requesting to lie down in bed- Therapist attempted to educate patient on how to doff TLSO, however she stated my niece will do that." Patient transitioned from sitting EOB to supine with the use of bed rail and Supv. Patient demonstrated appropriate log roll technique in order to maintain spinal precautions.   Throughout treatment session patient performed various sit/stands and transfers without AD and CGA.    Patient left supine in bed with bed alarm on, call bell within reach, family present and all needs met.    Therapy Documentation Precautions:  Precautions Precautions: Fall, Cervical, Back, Sternal Precaution Booklet Issued: Yes (comment) Precaution Comments: no cervical brace, TLSO OOB Required Braces or Orthoses: Spinal Brace Spinal Brace: Thoracolumbosacral orthotic, Applied in sitting position Restrictions Weight Bearing Restrictions: No  Balance: Balance Balance Assessed: Yes Standardized Balance Assessment Standardized Balance Assessment: Berg Balance Test Berg Balance Test Sit to Stand: Able to stand without using hands and stabilize independently Standing Unsupported: Able to stand safely 2 minutes Sitting with Back Unsupported but Feet Supported on Floor or Stool: Able to sit safely and securely 2 minutes Stand to Sit: Sits safely with minimal use of hands Transfers: Able to transfer safely, minor use of hands Standing Unsupported with Eyes Closed: Able to stand 10 seconds with supervision Standing Ubsupported with Feet Together: Able to place feet together independently and stand for 1 minute with supervision From Standing, Reach Forward with Outstretched Arm: Can reach forward >5 cm safely (2") From Standing Position, Pick up Object from Floor: Able to pick up shoe, needs supervision From Standing Position, Turn to  Look Behind Over each Shoulder: Turn sideways only but  maintains balance Turn 360 Degrees: Able to turn 360 degrees safely but slowly Standing Unsupported, Alternately Place Feet on Step/Stool: Able to complete >2 steps/needs minimal assist Standing Unsupported, One Foot in Front: Able to plae foot ahead of the other independently and hold 30 seconds Standing on One Leg: Able to lift leg independently and hold 5-10 seconds Total Score: 42   Therapy/Group: Individual Therapy  Bertine Schlottman 08/27/2022, 2:51 PM

## 2022-08-27 NOTE — Progress Notes (Signed)
PROGRESS NOTE   Subjective/Complaints: Pt in bed this AM. No new concerns or complaints.     Review of Systems  Constitutional:  Negative for chills and fever.  HENT:  Negative for congestion.   Eyes:  Negative for blurred vision and double vision.  Respiratory:  Negative for shortness of breath.   Cardiovascular:  Negative for palpitations.       Chest wall pain  Gastrointestinal:  Negative for abdominal pain, diarrhea, heartburn, nausea and vomiting.  Genitourinary: Negative.   Musculoskeletal:  Positive for neck pain.  Neurological:  Positive for weakness and headaches.    Objective:   No results found. Recent Labs    08/26/22 0711  WBC 7.6  HGB 10.0*  HCT 29.4*  PLT 218    Recent Labs    08/25/22 1825 08/26/22 0711  NA 139 139  K 3.5 3.1*  CL 104 101  CO2 28 28  GLUCOSE 142* 130*  BUN 10 13  CREATININE 0.70 0.65  CALCIUM 8.8* 8.9     Intake/Output Summary (Last 24 hours) at 08/27/2022 0109 Last data filed at 08/26/2022 1200 Gross per 24 hour  Intake 236 ml  Output --  Net 236 ml        Physical Exam: Vital Signs Blood pressure (!) 195/90, pulse 92, temperature (!) 97.5 F (36.4 C), resp. rate 18, height 5\' 2"  (1.575 m), weight 70.8 kg, last menstrual period 07/05/2020, SpO2 98 %.   General: Alert and oriented x 4, No apparent distress HEENT: MMM, conjugate gaze Keeps eyes closed-says this feels more comfortable Neck: Supple without JVD or lymphadenopathy, anterior neck incision C/D/I Heart: Reg rate and rhythm. No murmurs rubs or gallops Chest: CTA bilaterally without wheezes, rales, or rhonch Abdomen: Soft, non-tender, non-distended, bowel sounds positive. Extremities: No clubbing, cyanosis. Edema R digits. Multiple ecchymotic areas right forearm and few areas on L forearm. Left forearm with hematoma above her wrist with ecchymosis. Pulses are 2+ Psych: Pt's affect is appropriate. Pt is  cooperative, pleasant Skin: Clean and intact without signs of breakdown Neuro:  CN 2-12 intact. Follows commands. Able to answer most questions without difficulty.  Strength 5/5 b/l shoulder abduction, elbow flex and extension Right UE 4-/5 grip. LUE finger flexion 5/5 Strength 4/5 proximal b/l LE, 5/5 distal LE bilaterally Musculoskeletal:  Edema right digits  Decreased Neck ROM in all directions Chest wall tenderness-lidocaine patch in place  Assessment/Plan: 1. Functional deficits which require 3+ hours per day of interdisciplinary therapy in a comprehensive inpatient rehab setting. Physiatrist is providing close team supervision and 24 hour management of active medical problems listed below. Physiatrist and rehab team continue to assess barriers to discharge/monitor patient progress toward functional and medical goals  Care Tool:  Bathing    Body parts bathed by patient: Right arm, Face, Chest, Abdomen, Front perineal area, Buttocks, Right upper leg, Right lower leg, Left lower leg, Left upper leg, Left arm         Bathing assist Assist Level: Minimal Assistance - Patient > 75%     Upper Body Dressing/Undressing Upper body dressing   What is the patient wearing?: Pull over shirt    Upper body assist  Assist Level: Supervision/Verbal cueing    Lower Body Dressing/Undressing Lower body dressing      What is the patient wearing?: Underwear/pull up, Pants     Lower body assist Assist for lower body dressing: Minimal Assistance - Patient > 75%     Toileting Toileting    Toileting assist Assist for toileting: Minimal Assistance - Patient > 75%     Transfers Chair/bed transfer  Transfers assist     Chair/bed transfer assist level: Minimal Assistance - Patient > 75%     Locomotion Ambulation   Ambulation assist      Assist level: Minimal Assistance - Patient > 75% Assistive device: Hand held assist Max distance: 150 feet   Walk 10 feet  activity   Assist     Assist level: Minimal Assistance - Patient > 75% Assistive device: Hand held assist   Walk 50 feet activity   Assist    Assist level: Minimal Assistance - Patient > 75% Assistive device: Hand held assist    Walk 150 feet activity   Assist    Assist level: Minimal Assistance - Patient > 75% Assistive device: Hand held assist    Walk 10 feet on uneven surface  activity   Assist     Assist level: Contact Guard/Touching assist Assistive device: Other (comment) (right hand rail)   Wheelchair     Assist Is the patient using a wheelchair?: Yes Type of Wheelchair: Manual    Wheelchair assist level: Dependent - Patient 0% Max wheelchair distance: 300 feet    Wheelchair 50 feet with 2 turns activity    Assist        Assist Level: Dependent - Patient 0%   Wheelchair 150 feet activity     Assist      Assist Level: Dependent - Patient 0%   Blood pressure (!) 153/75, pulse 96, temperature (!) 97.5 F (36.4 C), temperature source Oral, resp. rate 18, height 5\' 2"  (1.575 m), weight 70.8 kg, last menstrual period 07/05/2020, SpO2 99 %.  Medical Problem List and Plan: 1. 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  Antithrombotics: -DVT/anticoagulation:  Pharmaceutical: Lovenox             -antiplatelet therapy: N/A 3. Pain Management:  Completed 5 days Ketorolac  --decrease tylenol to 650  mg QID, robaxin 1000mg  TID, Tramadol 50mg  PRN 4. Mood/Behavior/Sleep: LCSW to follow for evaluation and support.              -antipsychotic agents: N/A 5. Neuropsych/cognition: This patient is capable of making decisions on her own behalf. 6. Skin/Wound Care: Monitor wound for healing. Routine pressure relief measures.  7.  Fluids/Electrolytes/Nutrition: Monitor I/O. Intake has been reasonable.              --family encouraged to bring food from home.  8. Unstable Hangman's Fx s/p ACDF C2/C3: Cervical precautions. No cervical collar needed. 9. T12 and L3 vertebral body Fx: TLSO done at EOB. Do not remove for shower.             --back precautions. F/u with Dr. Zada Finders as outpatient 10.  Pre-diabetes: Hgb A1C-5.8 with impaired fasting glucose. CBGs well controlled overall. --Has been managed w/diet modifications at  home. Add CM restrictions.  11. B- pulmonary contusion/L 3-9th rib fx: Sternal precautions. Lidocaine patch --Encourage pulmonary hygeine. Flutter valve every 4 h.  12. ABLA: Hgb down from 14.0-->10 --Will monitor H/H with serial checks.  13. HTN             -Continue norvasc 10mg  daily, HCTZ 12.5 mg daily  -9/20 metoprolol 12.5 BID started  9/21 elevated BP, increase metoprolol to 25mg  BID 14. Hypokalemia  -kdur 10/21 bid, BMET 9/22, tomorrow 15. Elevated LFTs  -improved from 9/13, 9/20 AST 93, ALT 75  -Recheck tomorrow    LOS: 2 days A FACE TO FACE EVALUATION WAS PERFORMED  10/13 08/27/2022, 8:22 AM

## 2022-08-27 NOTE — Progress Notes (Signed)
   08/27/22 1130  Clinical Encounter Type  Visited With Patient and family together  Visit Type Initial;Spiritual support  Referral From Physician;Nurse Jennye Boroughs, MD; Alonza Bogus, RN)  Consult/Referral To Chaplain Melvenia Beam)  Recommendations Provided Quran and Intercessory Prayer  Spiritual Encounters  Spiritual Needs Sacred text;Prayer   Chaplain responded to spiritual care consultation request: "Patient Requests Prayer." Chaplain provided Jessica Andrade with a copy of Quran and shared in intercessory Prayer with patient and family that was consistent with their Muslim faith tradition. [address] [place], [place]., [phone]

## 2022-08-27 NOTE — Progress Notes (Signed)
Physical Therapy Session Note  Patient Details  Name: Jessica Andrade MRN: [id number] Date of Birth: 10-Jun-1971  Today's Date: 08/27/2022 PT Individual Time: 0800-0825 PT Individual Time Calculation (min): 25 min   Short Term Goals: Week 1:  PT Short Term Goal 1 (Week 1): STG=LTG due to ELOS  Skilled Therapeutic Interventions/Progress Updates:      Therapy Documentation Precautions:  Precautions Precautions: Fall, Cervical, Back, Sternal Precaution Booklet Issued: Yes (comment) Precaution Comments: no cervical brace, TLSO OOB Required Braces or Orthoses: Spinal Brace Spinal Brace: Thoracolumbosacral orthotic, Applied in sitting position Restrictions Weight Bearing Restrictions: No  Pt received in w/c returning to room with family and friends. Pt continues to report head pain described as heaviness and pt pre-medicated. Pt also reports she is tired as she did not sleep well previous night. Pt transported to main gym and CGA/min A for sit to stand with no AD. Pt required B UE support on rails for dynamic standing LE exercises of 6 inch step taps 1 x 10 and hip extension 1 x 5. Pt unsteady on final rep and required seated rest break and reported she felt "tired". Pt transported to room and vitals assessed with pulse: 98, O2: 97, and BP: 182/71 (101). Nursing notified and present for medication administration. Pt left seated in recliner with nurse present and all needs in reach.   Therapy/Group: Individual Therapy  Verl Dicker Verl Dicker PT, DPT  08/27/2022, 7:36 AM

## 2022-08-27 NOTE — Progress Notes (Signed)
Speech Language Pathology Daily Session Note  Patient Details  Name: Jessica Andrade MRN: [id number] Date of Birth: 1971/07/19  Today's Date: 08/27/2022 SLP Individual Time: 1050-1147 SLP Individual Time Calculation (min): 57 min  Short Term Goals: Week 1: SLP Short Term Goal 1 (Week 1): STG-LTG due to ELOS 9/27  Skilled Therapeutic Interventions:Skilled ST services focused on cognitive skills. Pt demonstrated dramatic improvement in cognitive skills due to reduced lethargy. Pt demonstrated sustained attention in 20 minute intervals with ability to sequence 4-6 step pictures of iADL tasks. Pt demonstrated verbal problem solving given personal novel medication times per day mod I. Medication management of pill organizer was not completed due to time restraints and request to use the bathroom. Pt demonstrated recall of back brace precautions and required supervision A during transfer with walker. Pt did stand up from toilet without requesting assistance or notifying SLP, noting slight reduction in safety awareness. Pt supports near cognitive baseline except for "sleepiness and dizziness" impacting attention on occasion. SLP recommends completing medication management task or other complex iADL and administering reassessment prior to likley earlier than expected d/c. Pt was left in room with family, call bell within reach and bed alarm set. SLP recommends to continue skilled services     Pain Pain Assessment Pain Scale: 0-10 Pain Score: 10-Worst pain ever Pain Type: Acute pain Pain Location: Neck Pain Orientation: Posterior Pain Descriptors / Indicators: Aching Pain Onset: On-going Pain Intervention(s): Distraction;Emotional support Multiple Pain Sites: Yes 2nd Pain Site Pain Score: 10 Pain Type: Acute pain Pain Location: Rib cage Pain Orientation: Right Pain Onset: On-going Pain Intervention(s): Emotional support;Distraction 3rd Pain Site Pain Score: 10 Pain Type: Acute pain Pain  Location: Back Pain Orientation: Lower;Mid Pain Onset: On-going Pain Intervention(s): Emotional support;Distraction  Therapy/Group: Individual Therapy  Justiss Gerbino  [facility] 08/27/2022, 12:27 PM

## 2022-08-27 NOTE — Progress Notes (Signed)
Occupational Therapy Session Note  Patient Details  Name: Jessica Andrade MRN: [id number] Date of Birth: November 09, 1971  Today's Date: 08/27/2022 OT Individual Time: 7341-9379 OT Individual Time Calculation (min): 61 min    Short Term Goals: Week 1:  OT Short Term Goal 1 (Week 1): STG= LTG d/t ELOS  Skilled Therapeutic Interventions/Progress Updates:  Pt awake in bed with friend helping doff TLSO upon OT arrival to the room. Pt reports, "Keep the neck and back straight, right?" Pt in agreement for OT session.  Therapy Documentation Precautions:  Precautions Precautions: Fall, Cervical, Back, Sternal Precaution Booklet Issued: Yes (comment) Precaution Comments: no cervical brace, TLSO OOB Required Braces or Orthoses: Spinal Brace Spinal Brace: Thoracolumbosacral orthotic, Applied in sitting position Restrictions Weight Bearing Restrictions: No Vital Signs: Please see "Flowsheet" for most recent vitals charted by nursing staff.  Pain: Pain Assessment Pain Scale: 0-10 Pain Score: 10-Worst pain ever Pain Type: Acute pain Pain Location: Head Pain Orientation: Posterior Pain Descriptors / Indicators: Aching Pain Onset: On-going Pain Intervention(s): Distraction;Emotional support Multiple Pain Sites: Yes 2nd Pain Site Pain Score: 10 Pain Type: Acute pain Pain Location: Rib cage Pain Orientation: Right Pain Onset: On-going Pain Intervention(s): Emotional support;Distraction 3rd Pain Site Pain Score: 10 Pain Type: Acute pain Pain Location: Back Pain Orientation: Lower;Mid Pain Onset: On-going Pain Intervention(s): Emotional support;Distraction  ADL: Pt declines need to perform ADLs at this time.  Pain Management: Pt reports high pain levels at this time and requests to remain at bed level. OT provides education regarding pain management techniques including use of heat/ice on muscles (to avoid open wounds/incisions), positioning to relieve pain, mental imagery, pursed-lip  breathing, and music to assist with calming and pain management. OT provides skilled demo and coaching for pt to perform pursed-lip breathing. OT provides education regarding contraindications, safety, concerns, and recommended timeframe for using heat/ice. OT applied head pack to posterior neck to avoid incision and L rib cage with pt reporting no discomfort. OT removed heat prior to ending OT session. Pt feel asleep during session indicating comfort while OT obtained handouts to provide education on use of heat/ice after DC. Pt and friends verbalize understanding.   Spinal Precautions & Bed Mobility: Pt asks about being able to position self in side-lying position in bed to assist with pain. OT provides education on log roll techniques, positioning self to maintain spinal alignment and use of pillows in between knees to decrease strain on spine. OT provides CGA to perform log roll techniques and safely position self in bed to maintain spinal precautions. Pt unable to tolerate L side-lying, however, reports increased comfort in R side-lying and remained in this position at end of session. Pt presents with improved comfort at end of session.   Pt remained in bed at end of session. Pt left resting comfortably in bed with personal belongings and call light within reach, bed alarm on and activated, bed in low position, 3 bed rails up, friends present in room, and comfort needs attended to.   Therapy/Group: Individual Therapy  Barbee Shropshire 08/27/2022, 12:58 PM

## 2022-08-28 ENCOUNTER — Other Ambulatory Visit: Payer: Managed Care, Other (non HMO)

## 2022-08-28 LAB — COMPREHENSIVE METABOLIC PANEL
ALT: 120 U/L — ABNORMAL HIGH (ref 0–44)
AST: 101 U/L — ABNORMAL HIGH (ref 15–41)
Albumin: 3.4 g/dL — ABNORMAL LOW (ref 3.5–5.0)
Alkaline Phosphatase: 87 U/L (ref 38–126)
Anion gap: 10 (ref 5–15)
BUN: 16 mg/dL (ref 6–20)
CO2: 26 mmol/L (ref 22–32)
Calcium: 9.4 mg/dL (ref 8.9–10.3)
Chloride: 101 mmol/L (ref 98–111)
Creatinine, Ser: 0.57 mg/dL (ref 0.44–1.00)
GFR, Estimated: >60 mL/min (ref >60)
Glucose, Bld: 129 mg/dL — ABNORMAL HIGH (ref 70–99)
Potassium: 3.3 mmol/L — ABNORMAL LOW (ref 3.5–5.1)
Sodium: 137 mmol/L (ref 135–145)
Total Bilirubin: 0.9 mg/dL (ref 0.3–1.2)
Total Protein: 7.2 g/dL (ref 6.5–8.1)

## 2022-08-28 LAB — GLUCOSE, CAPILLARY
Glucose-Capillary: 128 mg/dL — ABNORMAL HIGH (ref 70–99)
Glucose-Capillary: 93 mg/dL (ref 70–99)
Glucose-Capillary: 113 mg/dL — ABNORMAL HIGH (ref 70–99)
Glucose-Capillary: 199 mg/dL — ABNORMAL HIGH (ref 70–99)

## 2022-08-28 MED ORDER — POTASSIUM CHLORIDE 20 MEQ PO PACK
20.0000 meq | PACK | Freq: Two times a day (BID) | ORAL | Status: AC
  Administered 2022-08-28: 20 meq via ORAL
  Filled 2022-08-28: qty 1

## 2022-08-28 MED ORDER — ACETAMINOPHEN 325 MG PO TABS
650.0000 mg | ORAL_TABLET | Freq: Four times a day (QID) | ORAL | Status: DC | PRN
  Administered 2022-08-30: 650 mg via ORAL
  Filled 2022-08-28: qty 2

## 2022-08-28 MED ORDER — POTASSIUM CHLORIDE 20 MEQ PO PACK: 20.0000 meq | PACK | Freq: Two times a day (BID) | ORAL | Status: DC

## 2022-08-28 MED ORDER — HYDROCHLOROTHIAZIDE 25 MG PO TABS
25.0000 mg | ORAL_TABLET | Freq: Every day | ORAL | Status: DC
  Administered 2022-08-29 – 2022-09-01 (×4): 25 mg via ORAL
  Filled 2022-08-28 (×4): qty 1

## 2022-08-28 MED ORDER — POTASSIUM CHLORIDE 20 MEQ PO PACK
20.0000 meq | PACK | Freq: Every day | ORAL | Status: DC
  Administered 2022-08-29 – 2022-09-02 (×5): 20 meq via ORAL
  Filled 2022-08-28 (×5): qty 1

## 2022-08-28 NOTE — Progress Notes (Signed)
Speech Language Pathology Daily Session Note  Patient Details  Name: ANALEA MULLER MRN: [id number] Date of Birth: 1971/04/05  Today's Date: 08/28/2022 SLP Individual Time: 1430-1515 SLP Individual Time Calculation (min): 45 min SLP Missed Time: 15 Minutes Missed Time Reason: Other (Comment) (pt with 6 visitors in room toward end of session, requesting visit)  Short Term Goals: Week 1: SLP Short Term Goal 1 (Week 1): STG-LTG due to ELOS 9/27  Skilled Therapeutic Interventions: Pt seen for skilled ST with focus on cognitive goals, pt friend present and pt agreeable to therapeutic tasks. Pt and friend endorse patient is at baseline cognition. Pt does report memory challenges when she was initially hospitalized but functional memory much improved. SLP facilitating medication management task by providing re-education on current medicine regime with patient able to ID home meds vs new meds. Pt can detail home medication regime, names and dosages of prescriptions Mod I. Pt requesting to stop all new medicines initiated during hospital stay, encouraged her to speak with MD about that which she stated she would. Pt able to utilize pillbox with Supervision A verbal cues for accuracy. Pt states she used to use a pillbox but doesn't anymore, SLP and friend encouraging patient to utilize at home for safety. Pt had 5 additional visitors enter toward end of tx session, left in wheelchair with alarm intact and encouraged to call for transfer assistance when ready, room verbalized understanding. Cont ST POC.   Pain Pain Assessment Pain Scale: 0-10 Pain Score: 0-No pain  Therapy/Group: Individual Therapy  Dewaine Conger 08/28/2022, 3:12 PM

## 2022-08-28 NOTE — Plan of Care (Signed)
Behavioral Plan: Barnie Mort. RLAS VII-VIII.   No special considerations for behaviors required. Follow standard fall risk precautions, including bed alarm and chair alarm.

## 2022-08-28 NOTE — Progress Notes (Addendum)
PROGRESS NOTE   Subjective/Complaints: She is sitting in chair. No new complaints.     Review of Systems  Constitutional:  Negative for chills and fever.  HENT:  Negative for congestion.   Eyes:  Negative for blurred vision and double vision.  Respiratory:  Negative for shortness of breath.   Cardiovascular:  Negative for palpitations.       Chest wall pain  Gastrointestinal:  Negative for abdominal pain, diarrhea, heartburn, nausea and vomiting.  Genitourinary: Negative.   Musculoskeletal:  Positive for neck pain.  Neurological:  Positive for weakness and headaches.    Objective:   No results found. Recent Labs    08/26/22 0711  WBC 7.6  HGB 10.0*  HCT 29.4*  PLT 218    Recent Labs    08/26/22 0711 08/28/22 0645  NA 139 137  K 3.1* 3.3*  CL 101 101  CO2 28 26  GLUCOSE 130* 129*  BUN 13 16  CREATININE 0.65 0.57  CALCIUM 8.9 9.4     Intake/Output Summary (Last 24 hours) at 08/28/2022 0817 Last data filed at 08/27/2022 1825 Gross per 24 hour  Intake 600 ml  Output --  Net 600 ml         Physical Exam: Vital Signs Blood pressure (!) 197/97, pulse 93, temperature 97.7 F (36.5 C), temperature source Oral, resp. rate 16, height 5\' 2"  (1.575 m), weight 70.8 kg, last menstrual period 07/05/2020, SpO2 97 %.   General: Alert and oriented x 4, No apparent distress, wearing TLSO HEENT: MMM, conjugate gaze Keeps eyes closed-says this feels more comfortable Neck: Supple without JVD or lymphadenopathy, anterior neck incision C/D/I Heart: Reg rate and rhythm. No murmurs rubs or gallops Chest: CTA bilaterally without wheezes, rales, or rhonch Abdomen: Soft, non-tender, non-distended, bowel sounds positive. Extremities: No clubbing, cyanosis. Edema R digits. Multiple ecchymotic areas right forearm and few areas on L forearm. Left forearm with hematoma above her wrist with ecchymosis. Pulses are 2+ Psych:  Pt's affect is appropriate. Pt is cooperative, very pleasant Skin: Clean and intact without signs of breakdown Neuro:  CN 2-12 intact. Follows commands. Able to answer most questions without difficulty.  Strength 5/5 b/l shoulder abduction, elbow flex and extension Right UE 4-/5 grip. LUE finger flexion 5/5 Strength 4/5 proximal b/l LE, 5/5 distal LE bilaterally Musculoskeletal:  Decreased Neck ROM in all directions   Assessment/Plan: 1. Functional deficits which require 3+ hours per day of interdisciplinary therapy in a comprehensive inpatient rehab setting. Physiatrist is providing close team supervision and 24 hour management of active medical problems listed below. Physiatrist and rehab team continue to assess barriers to discharge/monitor patient progress toward functional and medical goals  Care Tool:  Bathing    Body parts bathed by patient: Right arm, Face, Chest, Abdomen, Front perineal area, Buttocks, Right upper leg, Right lower leg, Left lower leg, Left upper leg, Left arm         Bathing assist Assist Level: Minimal Assistance - Patient > 75%     Upper Body Dressing/Undressing Upper body dressing   What is the patient wearing?: Pull over shirt    Upper body assist Assist Level: Supervision/Verbal cueing  Lower Body Dressing/Undressing Lower body dressing      What is the patient wearing?: Underwear/pull up, Pants     Lower body assist Assist for lower body dressing: Minimal Assistance - Patient > 75%     Toileting Toileting    Toileting assist Assist for toileting: Minimal Assistance - Patient > 75%     Transfers Chair/bed transfer  Transfers assist     Chair/bed transfer assist level: Minimal Assistance - Patient > 75%     Locomotion Ambulation   Ambulation assist      Assist level: Minimal Assistance - Patient > 75% Assistive device: Hand held assist Max distance: 150 feet   Walk 10 feet activity   Assist     Assist level:  Minimal Assistance - Patient > 75% Assistive device: Hand held assist   Walk 50 feet activity   Assist    Assist level: Minimal Assistance - Patient > 75% Assistive device: Hand held assist    Walk 150 feet activity   Assist    Assist level: Minimal Assistance - Patient > 75% Assistive device: Hand held assist    Walk 10 feet on uneven surface  activity   Assist     Assist level: Contact Guard/Touching assist Assistive device: Other (comment) (right hand rail)   Wheelchair     Assist Is the patient using a wheelchair?: Yes Type of Wheelchair: Manual    Wheelchair assist level: Dependent - Patient 0% Max wheelchair distance: 300 feet    Wheelchair 50 feet with 2 turns activity    Assist        Assist Level: Dependent - Patient 0%   Wheelchair 150 feet activity     Assist      Assist Level: Dependent - Patient 0%   Blood pressure (!) 197/97, pulse 93, temperature 97.7 F (36.5 C), temperature source Oral, resp. rate 16, height 5\' 2"  (1.575 m), weight 70.8 kg, last menstrual period 07/05/2020, SpO2 97 %.  Medical Problem List and Plan: 1. 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  Antithrombotics: -DVT/anticoagulation:  Pharmaceutical: Lovenox             -antiplatelet therapy: N/A 3. Pain Management:  Completed 5 days Ketorolac  --decrease tylenol to 650  mg QID, robaxin 1000mg  TID, Tramadol 50mg  PRN 4. Mood/Behavior/Sleep: LCSW to follow for evaluation and support.              -antipsychotic agents: N/A 5. Neuropsych/cognition: This patient is capable of making decisions on her own behalf. 6. Skin/Wound Care: Monitor wound for healing. Routine pressure relief measures.  7. Fluids/Electrolytes/Nutrition: Monitor I/O. Intake has been  reasonable.              --family encouraged to bring food from home.  8. Unstable Hangman's Fx s/p ACDF C2/C3: Cervical precautions. No cervical collar needed. 9. T12 and L3 vertebral body Fx: TLSO done at EOB. Do not remove for shower.             --back precautions. F/u with Dr. Zada Finders as outpatient 10.  Pre-diabetes: Hgb A1C-5.8 with impaired fasting glucose. CBGs well controlled overall. --Has been managed w/diet modifications at home. Add CM restrictions.  11. B- pulmonary  contusion/L 3-9th rib fx: Sternal precautions. Lidocaine patch --Encourage pulmonary hygeine. Flutter valve every 4 h.  12. ABLA: Hgb down from 14.0-->10 --Will monitor H/H with serial checks.  13. HTN             -Continue norvasc 10mg  daily, HCTZ 12.5 mg daily  -9/20 metoprolol 12.5 BID started  9/21 elevated BP, increase metoprolol to 25mg  BID  9/22 increase HCTZ to 25mg  daily 14. Hypokalemia  -kcl ordered, up to 3.3 9/22, continue KCL 10/22 BID today,recheck labs 15. Elevated LFTs, ALT and AST still a little up, Tbili now WNL  -recheck Monday, discussed with pharmacy, will change tylenol to PRN    LOS: 3 days A FACE TO FACE EVALUATION WAS PERFORMED  10/22 08/28/2022, 8:17 AM
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
## 2022-08-28 NOTE — Progress Notes (Signed)
Physical Therapy Session Note  Patient Details  Name: Jessica Andrade MRN: [id number] Date of Birth: Jan 21, 1971  Today's Date: 08/28/2022 PT Individual Time: 1st Treatment Session: 1100-1200; 2nd Treatment Session: 8127-5170 PT Individual Time Calculation (min): 60 min; 30 min  Short Term Goals: Week 1:  PT Short Term Goal 1 (Week 1): STG=LTG due to ELOS  Skilled Therapeutic Interventions/Progress Updates:  1st Treatment Session- Patient greeted semi-reclined in bed and agreeable to PT treatment session. Patient transitioned to sitting EOB with Supv. Sit/stand without AD and SBA for safety.   Patient gait trained >150' from her room to ortho gym without AD and CGA- VC for improved foot clearance and to soften her steps as patient tend to "stomp" at times. Patient with minor LOBs at times, however is able to appropriately use her ankle strategy.   Patient weaved between x10 cones, x2 trials without AD and CGA/MinA for improved stability- Mild instability noted, however no physical assistance from therapist required.   Patient performed alternating toe taps to x10 cones, x2 trials without AD and CGA/MinA for improved stability- Notable decreased stance time on R LE compared to L LE.   Patient stepped laterally over x10 cones, x1 trial with each LE leading- Performed without AD and CGA/MinA for improved safety/stability. VC/TC and demonstration for proper technique with good improvements noted.   Patient requesting to return to her room and required encouragement, coaxing, and compromise in order to continue with treatment session.   Seated Therex- B LAQ with 4#, 2 x 10 B hip flexion with 4#, 2 x 10  Patient gait trained >150' back to her room without AD and CGA- VC for improved B step length/foot clearance with good improvements noted.    Patient returned to her room and requesting to visit her spouse, who is also on this floor in the hospital- Therapist checked-off [facility] for performing  transfers and gait in the room with patient. Maha took patient to husband's room via wheelchair and will return the patient to her room. Demonstrated appropriate removal of foot rests on wc and how to lock the brakes prior to standing and transferring patient- Patient and [facility] agreeable.    2nd Treatment Session- Patient greeted sitting upright in wheelchair in room and agreeable to PT treatment session. Patient performed sit/stand without AD and Supv for safety- Patient then gait trained ~120' from her room to rehab gym without AD and CGA for safety. Verbal cues for improved B foot clearance and decreased shuffling- Improvements noted, however continues to require verbal cues.   Patient performed sit to stand without UE use, then performed x4 alternating toe taps to 6" step with 2# weights donned to B LE, took four step backwards and returned to a seated position- Performed x10 repetitions with CGA.   Patient performed step ups to 6" step with 2# weights donned to B LE without UE support and CGA for safety- Patient performed x10 on each LE. No LOB noted with this activity, however patient did demonstrate increased difficulty when stepping with L LE.   Patient gait trained back to her room without AD and CGA/SBA for safety- patient left sitting in wheelchair with family present and all needs met. Family educated on not letting the patient stand up by herself and calling RN- Patient and family reported understanding.    Therapy Documentation Precautions:  Precautions Precautions: Fall, Cervical, Back, Sternal Precaution Booklet Issued: Yes (comment) Precaution Comments: no cervical brace, TLSO OOB Required Braces or Orthoses: Spinal Brace  Spinal Brace: Thoracolumbosacral orthotic, Applied in sitting position Restrictions Weight Bearing Restrictions: No  Therapy/Group: Individual Therapy  Jocelin Schuelke 08/28/2022, 7:57 AM

## 2022-08-28 NOTE — Progress Notes (Signed)
Occupational Therapy Session Note  Patient Details  Name: Jessica Andrade MRN: [id number] Date of Birth: 19-Nov-1971  Today's Date: 08/28/2022 OT Individual Time: 1593-0123 OT Individual Time Calculation (min): 60 min    Short Term Goals: Week 1:  OT Short Term Goal 1 (Week 1): STG= LTG d/t ELOS  Skilled Therapeutic Interventions/Progress Updates:    Pt received sitting in the w/c with moderate pain, unrated, in her back and sternum but reporting it is well controlled at rest. She completed 200 ft of functional mobility to the therapy gym with CGA, no device. She required min cueing for pacing. She completed 2x5 dynamic step ups with a narrow turn to challenge functional activity tolerance, dynamic standing balance, and postural control. Her niece [name] also attended session and they both had several questions re brace management, fractures, and d/c planning. Answered all questions and returned to room for more family education re bathing. Verbal education provided re fall risk reduction, energy conservation strategies, home carryover of transfer training, ADLs, and IADLs. Demonstration and hands on training completed for pt performance of UB/LB bathing and dressing at CGA level. Maha was able to return demo and assist pt with full body bathing/dressing at sink level. Pt also able to direct care as needed. Pt left supine with all needs met. Maha signed off for in room transfers and to assist with ADLs.    Therapy Documentation Precautions:  Precautions Precautions: Fall, Cervical, Back, Sternal Precaution Booklet Issued: Yes (comment) Precaution Comments: no cervical brace, TLSO OOB Required Braces or Orthoses: Spinal Brace Spinal Brace: Thoracolumbosacral orthotic, Applied in sitting position Restrictions Weight Bearing Restrictions: No   Therapy/Group: Individual Therapy  Curtis Sites 08/28/2022, 6:31 AM

## 2022-08-29 DIAGNOSIS — S12100S Unspecified displaced fracture of second cervical vertebra, sequela: Secondary | ICD-10-CM

## 2022-08-29 LAB — GLUCOSE, CAPILLARY
Glucose-Capillary: 216 mg/dL — ABNORMAL HIGH (ref 70–99)
Glucose-Capillary: 153 mg/dL — ABNORMAL HIGH (ref 70–99)
Glucose-Capillary: 193 mg/dL — ABNORMAL HIGH (ref 70–99)
Glucose-Capillary: 118 mg/dL — ABNORMAL HIGH (ref 70–99)

## 2022-08-29 LAB — BASIC METABOLIC PANEL
Anion gap: 10 (ref 5–15)
BUN: 14 mg/dL (ref 6–20)
CO2: 23 mmol/L (ref 22–32)
Calcium: 9.3 mg/dL (ref 8.9–10.3)
Chloride: 104 mmol/L (ref 98–111)
Creatinine, Ser: 0.65 mg/dL (ref 0.44–1.00)
GFR, Estimated: >60 mL/min (ref >60)
Glucose, Bld: 181 mg/dL — ABNORMAL HIGH (ref 70–99)
Potassium: 3.1 mmol/L — ABNORMAL LOW (ref 3.5–5.1)
Sodium: 137 mmol/L (ref 135–145)

## 2022-08-29 MED ORDER — LIDOCAINE 5 % EX PTCH
3.0000 | MEDICATED_PATCH | CUTANEOUS | Status: DC
  Administered 2022-08-30 – 2022-09-02 (×2): 3 via TRANSDERMAL
  Filled 2022-08-29 (×3): qty 3
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
## 2022-08-30 LAB — GLUCOSE, CAPILLARY
Glucose-Capillary: 126 mg/dL — ABNORMAL HIGH (ref 70–99)
Glucose-Capillary: 121 mg/dL — ABNORMAL HIGH (ref 70–99)
Glucose-Capillary: 104 mg/dL — ABNORMAL HIGH (ref 70–99)
Glucose-Capillary: 169 mg/dL — ABNORMAL HIGH (ref 70–99)

## 2022-08-30 NOTE — Progress Notes (Signed)
Physical Therapy Session Note  Patient Details  Name: Jessica Andrade MRN: [id number] Date of Birth: [birth year]  Today's Date: 08/30/2022 PT Individual Time: 1017-1112 PT Individual Time Calculation (min): 55 min & 40 min  PT Missed Time: 82 Minutes Missed Time Reason: Other (Comment) (spiritual reasons)  Short Term Goals: Week 1:  PT Short Term Goal 1 (Week 1): STG=LTG due to ELOS  Skilled Therapeutic Interventions/Progress Updates:      Therapy Documentation Precautions:  Precautions Precautions: Fall, Cervical, Back, Sternal Precaution Booklet Issued: Yes (comment) Precaution Comments: no cervical brace, TLSO OOB Required Braces or Orthoses: Spinal Brace Spinal Brace: Thoracolumbosacral orthotic, Applied in sitting position Restrictions Weight Bearing Restrictions: No  Treatment Session 1:  Pt received semi-reclined in bed, agreeable to PT session. Pt without verbal complaints of pain in session. Pt requires (S) with bed mobility, transfers and gait >1,000 ft with no AD. Pt ambulated without AD from room to outside [facility] atrium and navigated level and unlevel terrain with minor lossess of balance in which pt was able to self-recover. Pt participated in high level gait training consisting of backward walking, walking with high knees, lateral stepping and [place]. Pt reported fatigue following ambulation and transported by w/c to main gym. Pt navigated 4 steps with 1 HR (S) and reported dizziness following activity. Pt transported to room and vitals assessed with BP 126/76 (90) and pulse of 80. Pt reported improvement in symptoms following seated rest break. Pt left in bed with alarm on and all needs within reach.    Treatment Session 2:  Pt received seated edge of bed, agreeable to PT session. Pt declines pain and requested to utilize bathroom. PT dependently donned TLSO and pt (S) with sit to stand and ambulated ~10 ft with RW to bathroom + 150 ft no AD to main gym. PT set-up wii  bowling for dynamic standing balance on unlevel surface and pt declined participating in game. Pt transitioned to agility ladder exercises and requires close (S). Pt able to safely demonstrate good foot clearance with forward stepping over ladder. Pt performed icky shuffle (in, in, out, out) on ladder and following patient requested to return to room for spiritual reasons. Pt missed total of 35 minutes of skilled PT. Pt left seated in w/c at bedside with all needs in reach.    Therapy/Group: Individual Therapy  Verl Dicker Verl Dicker PT, DPT  08/30/2022, 7:46 AM
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
## 2022-08-31 LAB — COMPREHENSIVE METABOLIC PANEL
ALT: 61 U/L — ABNORMAL HIGH (ref 0–44)
AST: 32 U/L (ref 15–41)
Albumin: 3.5 g/dL (ref 3.5–5.0)
Alkaline Phosphatase: 109 U/L (ref 38–126)
Anion gap: 12 (ref 5–15)
BUN: 19 mg/dL (ref 6–20)
CO2: 26 mmol/L (ref 22–32)
Calcium: 10 mg/dL (ref 8.9–10.3)
Chloride: 98 mmol/L (ref 98–111)
Creatinine, Ser: 0.65 mg/dL (ref 0.44–1.00)
GFR, Estimated: >60 mL/min (ref >60)
Glucose, Bld: 115 mg/dL — ABNORMAL HIGH (ref 70–99)
Potassium: 3.5 mmol/L (ref 3.5–5.1)
Sodium: 136 mmol/L (ref 135–145)
Total Bilirubin: 0.8 mg/dL (ref 0.3–1.2)
Total Protein: 7.2 g/dL (ref 6.5–8.1)

## 2022-08-31 LAB — CBC
HCT: 34.5 % — ABNORMAL LOW (ref 36.0–46.0)
Hemoglobin: 11.4 g/dL — ABNORMAL LOW (ref 12.0–15.0)
MCH: 28.9 pg (ref 26.0–34.0)
MCHC: 33 g/dL (ref 30.0–36.0)
MCV: 87.6 fL (ref 80.0–100.0)
Platelets: 333 10*3/uL (ref 150–400)
RBC: 3.94 MIL/uL (ref 3.87–5.11)
RDW: 15.9 % — ABNORMAL HIGH (ref 11.5–15.5)
WBC: 7.9 10*3/uL (ref 4.0–10.5)
nRBC: 0 % (ref 0.0–0.2)

## 2022-08-31 LAB — GLUCOSE, CAPILLARY
Glucose-Capillary: 164 mg/dL — ABNORMAL HIGH (ref 70–99)
Glucose-Capillary: 169 mg/dL — ABNORMAL HIGH (ref 70–99)
Glucose-Capillary: 121 mg/dL — ABNORMAL HIGH (ref 70–99)
Glucose-Capillary: 159 mg/dL — ABNORMAL HIGH (ref 70–99)

## 2022-08-31 NOTE — Progress Notes (Signed)
PROGRESS NOTE   Subjective/Complaints: She is sitting in her chair with TLSO. No new concerns.    Review of Systems  Constitutional:  Negative for chills and fever.  HENT:  Negative for congestion.   Eyes:  Negative for blurred vision and double vision.  Respiratory:  Negative for shortness of breath.   Cardiovascular:  Negative for palpitations.       Chest wall pain  Gastrointestinal:  Negative for abdominal pain, constipation, diarrhea, heartburn, nausea and vomiting.  Genitourinary: Negative.   Musculoskeletal:  Positive for back pain, myalgias and neck pain.  Neurological:  Positive for weakness and headaches.    Objective:   No results found. No results for input(s): "WBC", "HGB", "HCT", "PLT" in the last 72 hours.  Recent Labs    08/29/22 0740  NA 137  K 3.1*  CL 104  CO2 23  GLUCOSE 181*  BUN 14  CREATININE 0.65  CALCIUM 9.3     Intake/Output Summary (Last 24 hours) at 08/31/2022 0800 Last data filed at 08/30/2022 1400 Gross per 24 hour  Intake 360 ml  Output --  Net 360 ml         Physical Exam: Vital Signs Blood pressure (!) 145/75, pulse 76, temperature 97.8 F (36.6 C), temperature source Oral, resp. rate 16, height 5\' 2"  (1.575 m), weight 67.4 kg, last menstrual period 07/05/2020, SpO2 96 %.   Constitutional: No distress . Vital signs reviewed. HEENT: NCAT, conjugate gaze, oral membranes moist Neck: supple Cardiovascular: RRR without murmur. No JVD    Respiratory/Chest: CTA Bilaterally without wheezes or rales. Normal effort    GI/Abdomen: BS +, non-tender, non-distended Extremities: No clubbing, cyanosis. Edema R digits. Multiple ecchymotic areas right forearm and few areas on L forearm. Left forearm with hematoma above her wrist with ecchymosis. Pulses are 2+ Psych: Pt's affect is appropriate. Pt is cooperative, very pleasant Skin: Clean and intact without signs of breakdown Neuro:   CN 2-12 intact. Follows commands. Able to answer most questions without difficulty.  Strength 5/5 b/l shoulder abduction, elbow flex and extension Right UE 4-/5 grip. LUE finger flexion 5/5 Strength 4/5 proximal b/l LE, 5/5 distal LE bilaterally Musculoskeletal:  Decreased Neck ROM in all directions. Discomfort at base of neck. TLSO in place   Assessment/Plan: 1. Functional deficits which require 3+ hours per day of interdisciplinary therapy in a comprehensive inpatient rehab setting. Physiatrist is providing close team supervision and 24 hour management of active medical problems listed below. Physiatrist and rehab team continue to assess barriers to discharge/monitor patient progress toward functional and medical goals  Care Tool:  Bathing    Body parts bathed by patient: Right arm, Face, Chest, Abdomen, Front perineal area, Buttocks, Right upper leg, Right lower leg, Left lower leg, Left upper leg, Left arm         Bathing assist Assist Level: Minimal Assistance - Patient > 75%     Upper Body Dressing/Undressing Upper body dressing   What is the patient wearing?: Pull over shirt    Upper body assist Assist Level: Supervision/Verbal cueing    Lower Body Dressing/Undressing Lower body dressing      What is the patient wearing?:  Underwear/pull up, Pants     Lower body assist Assist for lower body dressing: Minimal Assistance - Patient > 75%     Toileting Toileting    Toileting assist Assist for toileting: Minimal Assistance - Patient > 75%     Transfers Chair/bed transfer  Transfers assist     Chair/bed transfer assist level: Supervision/Verbal cueing     Locomotion Ambulation   Ambulation assist      Assist level: Supervision/Verbal cueing Assistive device: No Device Max distance: 1000 feet   Walk 10 feet activity   Assist     Assist level: Supervision/Verbal cueing Assistive device: No Device   Walk 50 feet activity   Assist     Assist level: Supervision/Verbal cueing Assistive device: No Device    Walk 150 feet activity   Assist    Assist level: Supervision/Verbal cueing Assistive device: No Device    Walk 10 feet on uneven surface  activity   Assist     Assist level: Supervision/Verbal cueing Assistive device: Other (comment) (right hand rail)   Wheelchair     Assist Is the patient using a wheelchair?: Yes Type of Wheelchair: Manual    Wheelchair assist level: Dependent - Patient 0% Max wheelchair distance: 300 feet    Wheelchair 50 feet with 2 turns activity    Assist        Assist Level: Dependent - Patient 0%   Wheelchair 150 feet activity     Assist      Assist Level: Dependent - Patient 0%   Blood pressure (!) 145/75, pulse 76, temperature 97.8 F (36.6 C), temperature source Oral, resp. rate 16, height 5\' 2"  (1.575 m), weight 67.4 kg, last menstrual period 07/05/2020, SpO2 96 %.  Medical Problem List and Plan: 1. 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  Antithrombotics: -DVT/anticoagulation:  Pharmaceutical: Lovenox             -antiplatelet therapy: N/A 3. Pain Management:  Completed 5 days Ketorolac  --decreased tylenol to 650  mg QID, robaxin 1000mg  TID, Tramadol 50mg  PRN -will add addnl lidocaine patches so that she can use on neck 4. Mood/Behavior/Sleep: LCSW to follow for evaluation and support.              -antipsychotic agents: N/A 5. Neuropsych/cognition: This patient is capable of making decisions on her own behalf. 6. Skin/Wound Care: Monitor wound for healing. Routine pressure relief measures.  7. Fluids/Electrolytes/Nutrition: Monitor I/O. Intake has been reasonable.              --family encouraged to bring food  from home.  8. Unstable Hangman's Fx s/p ACDF C2/C3: Cervical precautions. No cervical collar needed. 9. T12 and L3 vertebral body Fx: TLSO done at EOB. Do not remove for shower.             --back precautions. F/u with Dr. 07/07/2020 as outpatient 10.  Pre-diabetes: Hgb A1C-5.8 with impaired fasting glucose. CBGs well controlled overall. --Has been managed w/diet modifications at home. Add CM restrictions.  11. B- pulmonary contusion/L 3-9th rib fx: Sternal precautions. Lidocaine patch --Encourage pulmonary hygeine. Flutter valve every 4 h.  12. ABLA: Hgb down from 14.0-->10 --  Will monitor H/H with serial checks.  13. HTN             -Continue norvasc 10mg  daily, HCTZ 12.5 mg daily  -9/20 metoprolol 12.5 BID started  9/21 elevated BP, increase metoprolol to 25mg  BID  9/22 increased HCTZ to 25mg  daily  9/23 observe for response to these changes. Pain component  9/25 BP improving, continue to monitor 14. Hypokalemia  -kcl ordered, up to 3.3 9/22, continue KCL 10/23 BID today,recheck labs  -K+ up to 3.5 9/25, continue 10/22 KCL daily 15. Elevated LFTs, ALT and AST still a little up, Tbili now WNL  -recheck Monday, discussed with pharmacy, will change tylenol to PRN   -AST down to 32, ALT down to 61 on 9/25, improved, continue to monitor  LOS: 6 days A FACE TO FACE EVALUATION WAS PERFORMED  08/31/2022, 8:00 AM

## 2022-08-31 NOTE — Progress Notes (Signed)
Speech Language Pathology Daily Session Note  Patient Details  Name: Jessica Andrade MRN: [id number] Date of Birth: Nov 08, 1971  Today's Date: 08/31/2022 SLP Individual Time: 1120-1205 SLP Individual Time Calculation (min): 45 min  Short Term Goals: Week 1: SLP Short Term Goal 1 (Week 1): STG-LTG due to ELOS 9/27  Skilled Therapeutic Interventions:Skilled ST services focused on cognitive skills. SLP facilitated continued assessment of cognitive skills administering higher level CLQT. Pt scored WFL on all subsections (attention, executive function and visuospatial) with moderate deficits in recall. Pt demonstrated poor visual recall, but supports this is a baseline deficit. SLP suspects pt is near baseline. Pt was left in room with call bell within reach and bed alarm set. SLP recommends to continue skilled services.     Pain Pain Assessment Pain Score: 0-No pain  Therapy/Group: Individual Therapy  Miryah Ralls  [facility] 08/31/2022, 2:10 PM

## 2022-08-31 NOTE — Progress Notes (Signed)
Physical Therapy Session Note  Patient Details  Name: Jessica Andrade MRN: [id number] Date of Birth: 01-Sep-1971  Today's Date: 08/31/2022 PT Individual Time: 6282-4175 PT Individual Time Calculation (min): 45 min   Short Term Goals: Week 1:  PT Short Term Goal 1 (Week 1): STG=LTG due to ELOS  Skilled Therapeutic Interventions/Progress Updates:  Patient greeted sitting EOB with family present and agreeable to PT treatment session. RN present administering AM medications. Patient reporting 5/10 pain in neck/back.   While sitting EOB, PT donned TLSO dependently for time management and due to patient previously reporting she will have family assist her at home.   Patient gait trained >1,000 feet throughout the 4th floor without AD and supervision for safety- VC for increased speed and turning L/R with 180 degrees turns at times in order to improve dynamic stability.   Patient performed a car transfer without the use of an AD and supv for safety. VC for sitting her bottom on the seat prior to placing a LE int the car simulator. Patient was able to independently place B LE into/out of the car independently.   Patient then ascended/descended a low grade ramp and ambulated across mulch without AD and supv for safety.  Patient practiced donning TLSO x3 trials with MinA- Patient and family member were give this task as homework for the remainder of the day and will practice in her room with family.   Patient performed sit/stand x10 no UE support with supv.   Patient stood on airex foam while reaching outside her BOS to match playing cards on the back of the mirror, completed x15.   Patient ambulated back to her room ~120' without AD and supv. VC for increased cadence with good improvements noted in B foot clearance compared to last week.    Patient returned to her room sitting upright in wheelchair with family present and all needs met.     Therapy Documentation Precautions:   Precautions Precautions: Fall, Cervical, Back, Sternal Precaution Booklet Issued: Yes (comment) Precaution Comments: no cervical brace, TLSO OOB Required Braces or Orthoses: Spinal Brace Spinal Brace: Thoracolumbosacral orthotic, Applied in sitting position Restrictions Weight Bearing Restrictions: No  Therapy/Group: Individual Therapy  Jondavid Schreier 08/31/2022, 7:55 AM

## 2022-08-31 NOTE — Progress Notes (Signed)
Speech Language Pathology Discharge Summary  Patient Details  Name: Jessica Andrade MRN: [id number] Date of Birth: 11/24/1971  Date of Discharge from SLP service:{Time; dates multiple:304500300}  {chl ip rehab slp time calculations:304100500}   Skilled Therapeutic Interventions:  ***     Patient has met 4 of 4 long term goals.  Patient to discharge at overall Supervision;Modified Independent level.  Reasons goals not met:     Clinical Impression/Discharge Summary:   Pt made great progress meeting 4 out 4 goals, discharging at supervision A to mod I. Pt initially demonstrated severe deficits in lethargy impacting attention, recall, problem solving and safety awareness. Pt demonstrated dramatic improvement in alertness indicting only mild deficits in recall, attention and higher level problem solving. Pt scored WFL on CLQT, cognitive linguistic test except for moderate deficits in memory. Pt supports visual memory (in which she scored the lowest) is a baseline deficits and supports cognitive appeared near baseline. Pt's barriers at discharge are recall and safety awareness during periods of headache/dizziness, but family and friends are able to provide 24/hour supervision A. Education was completed with....Pt benefited from skilled ST services in order to maximize functional independence and reduce burden of care, continued skilled OPST services if desired.   Care Partner:  Caregiver Able to Provide Assistance: Yes  Type of Caregiver Assistance: Physical;Cognitive  Recommendation:  Outpatient SLP (if desired)  Rationale for SLP Follow Up: Maximize cognitive function and independence;Reduce caregiver burden   Equipment: N/A   Reasons for discharge: Discharged from hospital   Patient/Family Agrees with Progress Made and Goals Achieved: Yes    MADISON  CRATCH 08/31/2022, 2:14 PM  

## 2022-08-31 NOTE — Progress Notes (Signed)
Occupational Therapy Session Note  Patient Details  Name: Jessica Andrade MRN: [id number] Date of Birth: 1971/02/07   Session 1 Today's Date: 08/31/2022 OT Individual Time: 1747-1595 OT Individual Time Calculation (min): 54 min    Session 2 Today's Date: 08/31/2022 OT Individual Time: 3967-2897 OT Individual Time Calculation (min): 20 min  and Today's Date: 08/31/2022 OT Missed Time: 10 Minutes Missed Time Reason: Unavailable (comment)   Short Term Goals: Week 1:  OT Short Term Goal 1 (Week 1): STG= LTG d/t ELOS  Skilled Therapeutic Interventions/Progress Updates:    Session 1 Pt received supine with c/o a HA but reporting she is premedicated and has no further pain relief requests. She was agreeable to ADLs. Discussed 24/7 supervision need at home with her and her friend in the room. Pt requesting a letter for her sister to visit from [place]. Will relay request to CSW/MD. Pt requires min cueing to come to EOB via log rolling technique but (S) overall without use of bed rail. Ambulatory transfer into the bathroom with close (S) without device. She sat on the TTB for bathing with basins of water. She completed UB bathing seated with OT assisting donning/doffing TLSO. 2/2 religious request, pt wanting to maintain modesty and OT providing extra towels and problem solving through maintaining pt's request. She completed sit > stand for LB bathing with close (S), min cueing for safety. She used shower hose to spray peri areas, brace on and staying dry. She donned pants with (S) sit <> stand. In sitting she donned shirt and hijab with min A. Pt completed 125 ft of functional mobility to her husbands room on the unit with close (S). She visited for several minutes and then requested to stay in her husbands room so they could call her son together. Pt left sitting in the w/c with all needs met, chair alarm set.    Session 2 Pt received supine, on the phone and taking several minutes to finish  conversation. First 15 min missed. Had pt demonstrate donning TLSO to ensure home carryover- min A overall. She completed 200 ft of functional mobility with (S) overall. She completed 2x 5 functional stepping onto a 8 step with dynamic turns with close (S). She returned to her room and completed toileting at distant (S) level. Pt left supine with all needs met.    Therapy Documentation Precautions:  Precautions Precautions: Fall, Cervical, Back, Sternal Precaution Booklet Issued: Yes (comment) Precaution Comments: no cervical brace, TLSO OOB Required Braces or Orthoses: Spinal Brace Spinal Brace: Thoracolumbosacral orthotic, Applied in sitting position Restrictions Weight Bearing Restrictions: No  Therapy/Group: Individual Therapy  Curtis Sites 08/31/2022, 6:36 AM

## 2022-09-01 LAB — GLUCOSE, CAPILLARY
Glucose-Capillary: 132 mg/dL — ABNORMAL HIGH (ref 70–99)
Glucose-Capillary: 89 mg/dL (ref 70–99)
Glucose-Capillary: 110 mg/dL — ABNORMAL HIGH (ref 70–99)
Glucose-Capillary: 169 mg/dL — ABNORMAL HIGH (ref 70–99)

## 2022-09-01 MED ORDER — MAGIC MOUTHWASH: 10.0000 mL | Freq: Three times a day (TID) | ORAL | Status: DC | PRN

## 2022-09-01 MED ORDER — HYDROCHLOROTHIAZIDE 50 MG PO TABS
50.0000 mg | ORAL_TABLET | Freq: Every day | ORAL | Status: DC
  Administered 2022-09-02: 50 mg via ORAL
  Filled 2022-09-01: qty 1

## 2022-09-01 NOTE — Progress Notes (Signed)
Physical Therapy Discharge Summary  Patient Details  Name: Jessica Andrade MRN: [id number] Date of Birth: [birth year]  Date of Discharge from PT service:September 01, 2022  Today's Date: 09/01/2022 PT Individual Time: 0915-1030 PT Individual Time Calculation (min): 75 min    Patient has met 8 of 8 long term goals due to improved activity tolerance, improved balance, increased strength, and decreased pain.  Patient to discharge at an ambulatory level Independent.   Patient's spouse is also in the hospital and will be discharged at an independent level.   Reasons goals not met: NA  Recommendation:  Patient will benefit from ongoing skilled PT services in home health setting to continue to advance safe functional mobility, address ongoing impairments in impaired dynamic stability, decreased strength and minimize fall risk.  Equipment: No equipment provided  Reasons for discharge: treatment goals met and discharge from hospital  Patient/family agrees with progress made and goals achieved: Yes  PT Discharge Precautions/Restrictions Precautions Precautions: Fall;Other (comment) (Spinal precautions) Precaution Comments: no cervical brace, TLSO OOB Required Braces or Orthoses: Spinal Brace Cervical Brace: Soft collar;For comfort Spinal Brace: Thoracolumbosacral orthotic;Applied in sitting position Restrictions Weight Bearing Restrictions: No Pain Interference Pain Interference Pain Effect on Sleep: 1. Rarely or not at all Pain Interference with Therapy Activities: 1. Rarely or not at all Pain Interference with Day-to-Day Activities: 1. Rarely or not at all Vision/Perception  Vision - History Ability to See in Adequate Light: 0 Adequate Perception Perception: Within Functional Limits Praxis Praxis: Intact  Cognition Overall Cognitive Status: Within Functional Limits for tasks assessed Arousal/Alertness: Awake/alert Orientation Level: Oriented X4 Year: 2023 Month:  September Day of Week: Correct Attention: Sustained Sustained Attention: Appears intact Selective Attention: Appears intact Memory: Impaired Memory Impairment: Decreased recall of new information Awareness: Appears intact Problem Solving: Appears intact Safety/Judgment: Appears intact [employer] Scales of Cognitive Functioning: Purposeful, Appropriate Sensation Sensation Light Touch: Appears Intact Hot/Cold: Appears Intact Proprioception: Appears Intact Coordination Gross Motor Movements are Fluid and Coordinated: Yes Fine Motor Movements are Fluid and Coordinated: Yes Coordination and Movement Description: Limited by pain in her sternum, ribs, back ,and neck Finger Nose Finger Test: intact Motor  Motor Motor: Within Functional Limits Motor - Discharge Observations: Generalized weakness/poor endurance- Improving since initial evaluation  Mobility Bed Mobility Bed Mobility: Rolling Right;Supine to Sit;Sit to Supine Rolling Right: Independent Supine to Sit: Independent Sit to Supine: Independent Transfers Transfers: Sit to Stand;Stand to Sit;Stand Pivot Transfers Sit to Stand: Independent Stand to Sit: Independent Stand Pivot Transfers: Independent Transfer (Assistive device): None Locomotion  Gait Ambulation: Yes Gait Assistance: Independent Gait Distance (Feet): 150 Feet Assistive device: None Gait Gait: Yes Gait Pattern: Impaired Gait Pattern: Shuffle Gait velocity: decreased Stairs / Additional Locomotion Stairs: Yes Stairs Assistance: Independent Stair Management Technique: One rail Left Number of Stairs: 12 Height of Stairs: 6 Ramp: Independent Curb: Independent Wheelchair Mobility Wheelchair Mobility: No  Trunk/Postural Assessment  Cervical Assessment Cervical Assessment: Exceptions to [facility] (s/p ACDF) Thoracic Assessment Thoracic Assessment: Exceptions to WFL (TLSO OOB) Lumbar Assessment Lumbar Assessment: Exceptions to WFL (TLSO  OOB) Postural Control Postural Control: Deficits on evaluation Righting Reactions: delayed 2/2 pain and brace  Balance Balance Balance Assessed: Yes Standardized Balance Assessment Standardized Balance Assessment: Berg Balance Test Berg Balance Test Sit to Stand: Able to stand without using hands and stabilize independently Standing Unsupported: Able to stand safely 2 minutes Sitting with Back Unsupported but Feet Supported on Floor or Stool: Able to sit safely and securely 2 minutes Stand to  Sit: Sits safely with minimal use of hands Transfers: Able to transfer safely, minor use of hands Standing Unsupported with Eyes Closed: Able to stand 10 seconds safely Standing Ubsupported with Feet Together: Able to place feet together independently and stand 1 minute safely From Standing, Reach Forward with Outstretched Arm: Can reach confidently >25 cm (10") From Standing Position, Pick up Object from Floor: Able to pick up shoe safely and easily From Standing Position, Turn to Look Behind Over each Shoulder: Turn sideways only but maintains balance Turn 360 Degrees: Able to turn 360 degrees safely in 4 seconds or less Standing Unsupported, Alternately Place Feet on Step/Stool: Able to stand independently and safely and complete 8 steps in 20 seconds Standing Unsupported, One Foot in Front: Able to place foot tandem independently and hold 30 seconds Standing on One Leg: Able to lift leg independently and hold > 10 seconds Total Score: 54 Static Sitting Balance Static Sitting - Balance Support: Feet supported Static Sitting - Level of Assistance: 6: Modified independent (Device/Increase time) Dynamic Sitting Balance Dynamic Sitting - Balance Support: Feet supported Dynamic Sitting - Level of Assistance: 7: Independent Static Standing Balance Static Standing - Balance Support: No upper extremity supported;During functional activity Static Standing - Level of Assistance: 6: Modified independent  (Device/Increase time) Dynamic Standing Balance Dynamic Standing - Balance Support: During functional activity;No upper extremity supported Dynamic Standing - Level of Assistance: 6: Modified independent (Device/Increase time) Extremity Assessment  RUE Assessment RUE Assessment: Within Functional Limits LUE Assessment LUE Assessment: Within Functional Limits RLE Assessment RLE Assessment: Within Functional Limits General Strength Comments: grossly 4/5 LLE Assessment LLE Assessment: Within Functional Limits General Strength Comments: grossly 4/5  Skilled Intervention: Patient greeted supine in bed and agreeable to PT treatment session. Patient was able to complete bed mobility independently and while sitting EOB patient was able to don TLSO with supervision and minor verbal cues- Patient reported practicing yesterday.   Patient ambulated throughout the 4th floor (>1,000 feet) without the use of an assistive device independently. Patient was able to perform a car transfer without AD independently. She then ascended/descended a low grade ramp and ambulated over mulch independently. Patient ascended/descended x12 steps with S HR on left independently. Patient completed the [facility] Assessment where she was able to improve her score by 12 points. Patient demonstrates decreased/low fall risk as noted by score of 54/56 on Berg Balance Scale- (<36= high risk for falls, close to 100%; 37-45 significant >80%; 46-51 moderate >50%; 52-55 lower >25%). Please see below for further details regarding the assessment.   Patient ambulated throughout the 4th floor and outside (>1,000 feet) without the use of an assistive device independently.   Patient returned to her room supine in bed with call bell within reach, family present and all needs met. Patient given room privileges with AD.     Ceola Para 09/01/2022, 9:56 AM

## 2022-09-01 NOTE — Progress Notes (Signed)
Patient ID: Jessica Andrade, female   DOB: 1971-06-09, 51 y.o.   MRN: [id number]  Met with pt and friend pt asking for a MD letter so a family member can come from [place] to take care of her at home. Discussed with her she is independent and does not need assist. Was made independent in her room today in preparation for discharge tomorrow. She wants help with home management and her care. Told her will ask MD regarding this, he reports he will sign a letter if it helps.

## 2022-09-01 NOTE — Progress Notes (Incomplete)
Inpatient Rehabilitation Care Coordinator Discharge Note   Patient Details  Name: Jessica Andrade MRN: [id number] Date of Birth: [birth year]   Discharge location: HOME WITH HUSBAND AND NEPHEW ALONG WITH FRIENDS  Length of Stay: 8 DAYS  Discharge activity level: INDEPENDENT LEVEL  Home/community participation: ACTIVE  Patient response QT:MAUQJF Literacy - How often do you need to have someone help you when you read instructions, pamphlets, or other written material from your doctor or pharmacy?: Never  Patient response HL:KTGYBW Isolation - How often do you feel lonely or isolated from those around you?: Never  Services provided included: MD, PT, RD, OT, SLP, RN, CM, TR, Pharmacy, SW  Financial Services:  Charity fundraiser Utilized: Publishing copy offered to/list presented to: PT  Follow-up services arranged:  Home Health, Patient/Family has no preference for HH/DME agencies [facility]      Patient response to transportation need: Is the patient able to respond to transportation needs?: Yes In the past 12 months, has lack of transportation kept you from medical appointments or from getting medications?: No In the past 12 months, has lack of transportation kept you from meetings, work, or from getting things needed for daily living?: No    Comments (or additional information): PT DID WELL AND REACHED INDEPENDENT LEVEL, ALTHOUGH SHE WANTS TO BE CARED FOR BY OTHERS SHE IS ABLE TO DO ON [place]. LETTER GIVEN TO TRY TO GET HER SISTER HER FROM [place].   Patient/Family verbalized understanding of follow-up arrangements:  Yes  Individual responsible for coordination of the follow-up plan: SELF AND HASSAB-NEPHEW [phone]  Confirmed correct DME delivered: Elease Hashimoto 09/01/2022    [place], [place]

## 2022-09-01 NOTE — Plan of Care (Signed)
  Problem: RH Memory Goal: LTG Patient will demonstrate ability for day to day (SLP) Description: LTG:   Patient will demonstrate ability for day to day recall/carryover during cognitive/linguistic activities with assist  (SLP) Outcome: Completed/Met Goal: LTG Patient will use memory compensatory aids to (SLP) Description: LTG:  Patient will use memory compensatory aids to recall biographical/new, daily complex information with cues (SLP) Outcome: Completed/Met   Problem: RH Attention Goal: LTG Patient will demonstrate this level of attention during functional activites (SLP) Description: LTG:  Patient will will demonstrate this level of attention during functional activites (SLP) Outcome: Completed/Met   Problem: RH Awareness Goal: LTG: Patient will demonstrate awareness during functional activites type of (SLP) Description: LTG: Patient will demonstrate awareness during functional activites type of (SLP) Outcome: Completed/Met

## 2022-09-01 NOTE — Progress Notes (Signed)
PROGRESS NOTE   Subjective/Complaints: Working with therapy wearing TLSO. She continues to have occasional head and neck pain but tylenol/tramadol keeping this under control.  LBM 9/24   Review of Systems  Constitutional:  Negative for chills and fever.  HENT:  Negative for congestion.   Eyes:  Negative for blurred vision, double vision and photophobia.  Respiratory:  Negative for shortness of breath.   Cardiovascular:  Negative for palpitations.       Chest wall pain  Gastrointestinal:  Negative for abdominal pain, constipation, diarrhea, heartburn, nausea and vomiting.  Genitourinary: Negative.   Musculoskeletal:  Positive for myalgias and neck pain.  Neurological:  Positive for weakness and headaches.    Objective:   No results found. Recent Labs    08/31/22 0847  WBC 7.9  HGB 11.4*  HCT 34.5*  PLT 333    Recent Labs    08/31/22 0847  NA 136  K 3.5  CL 98  CO2 26  GLUCOSE 115*  BUN 19  CREATININE 0.65  CALCIUM 10.0     Intake/Output Summary (Last 24 hours) at 09/01/2022 0817 Last data filed at 08/31/2022 1824 Gross per 24 hour  Intake 360 ml  Output --  Net 360 ml         Physical Exam: Vital Signs Blood pressure (!) 163/79, pulse 72, temperature 97.9 F (36.6 C), temperature source Oral, resp. rate 16, height 5\' 2"  (1.575 m), weight 67.4 kg, last menstrual period 07/05/2020, SpO2 99 %.   Constitutional: No distress . Vital signs reviewed. HEENT: NCAT, conjugate gaze, oral membranes moist Neck: supple Cardiovascular: RRR without murmur. No JVD    Respiratory/Chest: CTA Bilaterally without wheezes or rales. Normal effort    GI/Abdomen: Soft, BS +, non-tender, non-distended Extremities: No clubbing, cyanosis. Edema R digits. Multiple ecchymotic areas right forearm and few areas on L forearm-improving. Left forearm with hematoma above her wrist with ecchymosis. Pulses are 2+ Psych: Pt's affect  is appropriate. Pt is cooperative, very pleasant Skin: Clean and intact without signs of breakdown Neuro:  CN 2-12 grossly intact. Follows commands. Able to answer most questions without difficulty.  Strength 5/5 b/l shoulder abduction, elbow flex and extension Right UE 4-/5 grip. LUE finger flexion 5/5 Strength 4/5 proximal b/l LE, 5/5 distal LE bilaterally Musculoskeletal:  Decreased Neck ROM in all directions. Discomfort at base of neck. TLSO in place   Assessment/Plan: 1. Functional deficits which require 3+ hours per day of interdisciplinary therapy in a comprehensive inpatient rehab setting. Physiatrist is providing close team supervision and 24 hour management of active medical problems listed below. Physiatrist and rehab team continue to assess barriers to discharge/monitor patient progress toward functional and medical goals  Care Tool:  Bathing    Body parts bathed by patient: Right arm, Face, Chest, Abdomen, Front perineal area, Buttocks, Right upper leg, Right lower leg, Left lower leg, Left upper leg, Left arm         Bathing assist Assist Level: Independent     Upper Body Dressing/Undressing Upper body dressing   What is the patient wearing?: Pull over shirt, Orthosis Orthosis activity level: Performed by helper  Upper body assist Assist Level: Independent  with assistive device    Lower Body Dressing/Undressing Lower body dressing      What is the patient wearing?: Underwear/pull up, Pants     Lower body assist Assist for lower body dressing: Independent with assitive device     Toileting Toileting    Toileting assist Assist for toileting: Independent     Transfers Chair/bed transfer  Transfers assist     Chair/bed transfer assist level: Independent     Locomotion Ambulation   Ambulation assist      Assist level: Supervision/Verbal cueing Assistive device: No Device Max distance: 1000 feet   Walk 10 feet activity   Assist      Assist level: Supervision/Verbal cueing Assistive device: No Device   Walk 50 feet activity   Assist    Assist level: Supervision/Verbal cueing Assistive device: No Device    Walk 150 feet activity   Assist    Assist level: Supervision/Verbal cueing Assistive device: No Device    Walk 10 feet on uneven surface  activity   Assist     Assist level: Supervision/Verbal cueing Assistive device: Other (comment) (right hand rail)   Wheelchair     Assist Is the patient using a wheelchair?: Yes Type of Wheelchair: Manual    Wheelchair assist level: Dependent - Patient 0% Max wheelchair distance: 300 feet    Wheelchair 50 feet with 2 turns activity    Assist        Assist Level: Dependent - Patient 0%   Wheelchair 150 feet activity     Assist      Assist Level: Dependent - Patient 0%   Blood pressure (!) 163/79, pulse 72, temperature 97.9 F (36.6 C), temperature source Oral, resp. rate 16, height 5\' 2"  (1.575 m), weight 67.4 kg, last menstrual period 07/05/2020, SpO2 99 %.  Medical Problem List and Plan: 1. 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  Antithrombotics: -DVT/anticoagulation:  Pharmaceutical: Lovenox             -antiplatelet therapy: N/A 3. Pain Management:  Completed 5 days Ketorolac  --decreased tylenol to 650  mg QID prn, robaxin 1000mg  TID, Tramadol 50mg  PRN -will add addnl lidocaine patches so that she can use on neck 4. Mood/Behavior/Sleep: LCSW to follow for evaluation and support.              -antipsychotic agents: N/A 5. Neuropsych/cognition: This patient is capable  of making decisions on her own behalf. 6. Skin/Wound Care: Monitor wound for healing. Routine pressure relief measures.  7. Fluids/Electrolytes/Nutrition: Monitor I/O. Intake has been reasonable.              --family encouraged to bring food from home.  8. Unstable Hangman's Fx s/p ACDF C2/C3: Cervical precautions. No cervical collar needed. 9. T12 and L3 vertebral body Fx: TLSO done at EOB. Do not remove for shower.             --back precautions. F/u with Dr. 10/27 as outpatient 10.  Pre-diabetes: Hgb A1C-5.8 with impaired fasting glucose. CBGs well controlled overall. --Has been  managed w/diet modifications at home. Add CM restrictions.  11. B- pulmonary contusion/L 3-9th rib fx: Sternal precautions. Lidocaine patch --Encourage pulmonary hygeine. Flutter valve every 4 h.  12. ABLA: Hgb down from 14.0-->10 --Will monitor H/H with serial checks.  13. HTN             -Continue norvasc 10mg  daily, HCTZ 12.5 mg daily  -9/20 metoprolol 12.5 BID started  9/21 elevated BP, increase metoprolol to 25mg  BID  9/22 increased HCTZ to 25mg  daily  9/23 observe for response to these changes. Pain component  9/26 HCTZ increased to 50mg  daily 14. Hypokalemia  -kcl ordered, up to 3.3 9/22, continue KCL 10/23 BID today,recheck labs  -K+ up to 3.5 9/25, continue KCL daily, recheck with next routine labs 15. Elevated LFTs, ALT and AST still a little up, Tbili now WNL  -recheck Monday, discussed with pharmacy, will change tylenol to PRN   -AST down to 32, ALT down to 61 on 9/25, improved, recheck with next routine labs  LOS: 7 days A FACE TO FACE EVALUATION WAS PERFORMED  10/25 09/01/2022, 8:17 AM

## 2022-09-01 NOTE — Progress Notes (Signed)
Occupational Therapy Discharge Summary  Patient Details  Name: AMIRACLE NEISES MRN: [id number] Date of Birth: 10-27-1971  Date of Discharge from OT service:September 01, 2022  Today's Date: 09/01/2022 OT Individual Time: 1420-1500 OT Individual Time Calculation (min): 40 min    Patient has met 9 of 9 long term goals due to improved activity tolerance, improved balance, postural control, ability to compensate for deficits, improved attention, improved awareness, and improved coordination.  Patient to discharge at overall Modified Independent level.  Patient's care partner is independent to provide the necessary cognitive assistance at discharge.  Family education has been completed with her niece [name] and family reports they will provide supervision at home.   Reasons goals not met: All goals met.   Recommendation:  Patient will benefit from ongoing skilled OT services in home health setting to continue to advance functional skills in the area of BADL.  Equipment: No equipment provided  Reasons for discharge: treatment goals met and discharge from hospital  Patient/family agrees with progress made and goals achieved: Yes  Skilled Intervention  Pt received supine with the TLSO already on, no c/o pain. Pt completed bed mobility to EOB with mod I. She completed 150 ft of functional mobility to the therapy gym with mod I. She completed functional mobility task aimed at simulated IADL laundry task- carrying laundry basket and gathering small weighted balls. She required an extended rest break following. She then completed 3x10 mini squats with focus on increasing quad strength and thus independence with ADL transfers. She requested to have a HEP and one was created for her with focus on functional activity tolerance and dynamic standing balance. She returned to her room and was left supine with all needs met.    Access Code: [email or web address] URL: [email or web address] Date:  09/01/2022 Prepared by: Laverle Hobby  Exercises - Sit to Stand  - 1 x daily - 7 x weekly - 3 sets - 10 reps - Standing Hip Abduction with Counter Support  - 1 x daily - 7 x weekly - 3 sets - 10 reps - Mini Squat with Counter Support  - 1 x daily - 7 x weekly - 3 sets - 10 reps - Heel Raises with Counter Support  - 1 x daily - 7 x weekly - 3 sets - 10 reps   OT Discharge Precautions/Restrictions  Precautions Precautions: Fall Precaution Comments: no cervical brace, TLSO OOB Spinal Brace: Thoracolumbosacral orthotic;Applied in sitting position Restrictions Weight Bearing Restrictions: No   ADL ADL Eating: Independent Where Assessed-Eating: Edge of bed Grooming: Independent Where Assessed-Grooming: Edge of bed Upper Body Bathing: Modified independent Where Assessed-Upper Body Bathing: Sitting at sink Lower Body Bathing: Modified independent Where Assessed-Lower Body Bathing: Sitting at sink Upper Body Dressing: Modified independent (Device) Where Assessed-Upper Body Dressing: Sitting at sink Lower Body Dressing: Modified independent Where Assessed-Lower Body Dressing: Sitting at sink Toileting: Modified independent Where Assessed-Toileting: Glass blower/designer: Psychiatric nurse Method: Human resources officer: Modified independent Clinical cytogeneticist Method: Librarian, academic: Gaffer Baseline Vision/History: 0 No visual deficits Patient Visual Report: No change from baseline Perception  Perception: Within Functional Limits Praxis Praxis: Intact Cognition Cognition Overall Cognitive Status: Impaired/Different from baseline Arousal/Alertness: Awake/alert Orientation Level: Person;Place;Situation Person: Oriented Place: Oriented Situation: Oriented Memory: Impaired Memory Impairment: Decreased recall of new information Attention: Selective Sustained Attention: Appears intact Selective Attention: Appears  intact Awareness: Appears intact Awareness Impairment: Anticipatory impairment Problem Solving: Appears intact Safety/Judgment: Appears intact [employer] of  Cognitive Functioning: Purposeful, Appropriate Brief Interview for Mental Status (BIMS) Repetition of Three Words (First Attempt): 3 Temporal Orientation: Year: Correct Temporal Orientation: Month: Accurate within 5 days Temporal Orientation: Day: Correct Recall: "Sock": No, could not recall Recall: "Blue": Yes, no cue required Recall: "Bed": Yes, no cue required BIMS Summary Score: 13 Sensation Sensation Light Touch: Appears Intact Hot/Cold: Appears Intact Proprioception: Appears Intact Coordination Gross Motor Movements are Fluid and Coordinated: No Fine Motor Movements are Fluid and Coordinated: Yes Coordination and Movement Description: Limited by pain in her sternum, ribs, back ,and neck Finger Nose Finger Test: intact Motor  Motor Motor: Within Functional Limits Mobility  Bed Mobility Bed Mobility: Rolling Right;Supine to Sit;Sit to Supine Rolling Right: Independent Supine to Sit: Independent Sit to Supine: Independent Transfers Sit to Stand: Independent Stand to Sit: Independent  Trunk/Postural Assessment  Cervical Assessment Cervical Assessment: Exceptions to [facility] (s/p ACDF) Thoracic Assessment Thoracic Assessment: Exceptions to WFL (TLSO OOB) Lumbar Assessment Lumbar Assessment: Exceptions to WFL (L3 fx, TLSO OOB) Postural Control Postural Control: Deficits on evaluation Righting Reactions: delayed 2/2 pain and brace  Balance Balance Balance Assessed: Yes Static Sitting Balance Static Sitting - Balance Support: Feet supported Dynamic Sitting Balance Dynamic Sitting - Balance Support: Feet supported Dynamic Sitting - Level of Assistance: 7: Independent Static Standing Balance Static Standing - Balance Support: No upper extremity supported;During functional activity Static Standing -  Level of Assistance: 6: Modified independent (Device/Increase time) Dynamic Standing Balance Dynamic Standing - Balance Support: During functional activity;No upper extremity supported Dynamic Standing - Level of Assistance: 6: Modified independent (Device/Increase time) Extremity/Trunk Assessment RUE Assessment RUE Assessment: Within Functional Limits LUE Assessment LUE Assessment: Within Functional Limits   Curtis Sites 09/01/2022, 11:25 AM

## 2022-09-01 NOTE — Progress Notes (Signed)
Occupational Therapy Session Note  Patient Details  Name: Jessica Andrade MRN: [id number] Date of Birth: 1971/01/23  Today's Date: 09/01/2022 OT Individual Time: 1106-1200 OT Individual Time Calculation (min): 54 min    Short Term Goals: Week 1:  OT Short Term Goal 1 (Week 1): STG= LTG d/t ELOS  Skilled Therapeutic Interventions/Progress Updates:    Pt received supine with 5/10 pain in her neck and back, agreeable to OT session and increased mobility as pain intervention. She donned the brace EOB with min cueing for problem solving through novel twisting of the brace. She completed an ambulatory transfer into the bathroom with mod I. She sat to doff brace and completed UB bathing with mod I. Pt non compliant with instructions re need for brace to be on when standing for LB bathing and dismissing OT education/cueing.. Discussed d/c planning throughout session. She returned to EOB and required cueing to not return to supine and to finish out therapy session. She requested to not leave the room and OT left to gather supplies for remainder of session. Upon return pt had 4 visitors in room. She completed dynamic standing balance activity with reciprocal tapping of cones- completing with (S) initially and progressing to mod I. She completed functional reaching task with a sit <> stand component with min cueing for technique and mod I overall- challenging UE strengthening within restrictions and functional activity tolerance..Pt left supine with all needs met. Pt mod I in the room.    Therapy Documentation Precautions:  Precautions Precautions: Fall, Cervical, Back, Sternal Precaution Booklet Issued: Yes (comment) Precaution Comments: no cervical brace, TLSO OOB Required Braces or Orthoses: Spinal Brace Spinal Brace: Thoracolumbosacral orthotic, Applied in sitting position Restrictions Weight Bearing Restrictions: No  Therapy/Group: Individual Therapy  Curtis Sites 09/01/2022, 6:43 AM

## 2022-09-02 ENCOUNTER — Other Ambulatory Visit (HOSPITAL_COMMUNITY): Payer: Self-pay

## 2022-09-02 LAB — GLUCOSE, CAPILLARY: Glucose-Capillary: 113 mg/dL — ABNORMAL HIGH (ref 70–99)

## 2022-09-02 MED ORDER — DM-GUAIFENESIN ER 30-600 MG PO TB12
1.0000 | ORAL_TABLET | Freq: Two times a day (BID) | ORAL | 0 refills | Status: DC
  Filled 2022-09-02: qty 14, 7d supply, fill #0

## 2022-09-02 MED ORDER — LIDOCAINE 5 % EX PTCH
3.0000 | MEDICATED_PATCH | CUTANEOUS | 0 refills | Status: DC
  Filled 2022-09-02: qty 30, 10d supply, fill #0

## 2022-09-02 MED ORDER — POLYETHYLENE GLYCOL 3350 17 GM/SCOOP PO POWD
17.0000 g | Freq: Every day | ORAL | 0 refills | Status: DC
  Filled 2022-09-02: qty 238, 14d supply, fill #0

## 2022-09-02 MED ORDER — AMLODIPINE BESYLATE 5 MG PO TABS
10.0000 mg | ORAL_TABLET | Freq: Every day | ORAL | 0 refills | Status: DC
  Filled 2022-09-02: qty 60, 30d supply, fill #0

## 2022-09-02 MED ORDER — HYDROCHLOROTHIAZIDE 25 MG PO TABS
25.0000 mg | ORAL_TABLET | Freq: Every day | ORAL | 0 refills | Status: DC
  Filled 2022-09-02: qty 30, 30d supply, fill #0

## 2022-09-02 MED ORDER — METOPROLOL TARTRATE 25 MG PO TABS
25.0000 mg | ORAL_TABLET | Freq: Two times a day (BID) | ORAL | 0 refills | Status: DC
  Filled 2022-09-02: qty 60, 30d supply, fill #0

## 2022-09-02 MED ORDER — LIDOCAINE 5 % EX PTCH
3.0000 | MEDICATED_PATCH | CUTANEOUS | 0 refills | Status: DC
  Filled 2022-09-02: qty 90, 30d supply, fill #0

## 2022-09-02 MED ORDER — POTASSIUM CHLORIDE CRYS ER 20 MEQ PO TBCR
40.0000 meq | EXTENDED_RELEASE_TABLET | Freq: Every day | ORAL | 0 refills | Status: DC
  Filled 2022-09-02: qty 30, 15d supply, fill #0

## 2022-09-02 MED ORDER — PANTOPRAZOLE SODIUM 40 MG PO TBEC
40.0000 mg | DELAYED_RELEASE_TABLET | Freq: Every day | ORAL | 0 refills | Status: DC
  Filled 2022-09-02: qty 30, 30d supply, fill #0

## 2022-09-02 MED ORDER — ACETAMINOPHEN 325 MG PO TABS
650.0000 mg | ORAL_TABLET | Freq: Four times a day (QID) | ORAL | 0 refills | Status: DC | PRN
  Filled 2022-09-02: qty 30, 4d supply, fill #0

## 2022-09-02 MED ORDER — METHOCARBAMOL 500 MG PO TABS
1000.0000 mg | ORAL_TABLET | Freq: Three times a day (TID) | ORAL | 0 refills | Status: DC
  Filled 2022-09-02: qty 180, 30d supply, fill #0

## 2022-09-02 MED ORDER — TRAMADOL HCL 50 MG PO TABS
50.0000 mg | ORAL_TABLET | Freq: Every day | ORAL | 0 refills | Status: DC | PRN
  Filled 2022-09-02: qty 5, 5d supply, fill #0

## 2022-09-02 MED ORDER — HYDROCHLOROTHIAZIDE 50 MG PO TABS
50.0000 mg | ORAL_TABLET | Freq: Every day | ORAL | 0 refills | Status: DC
  Filled 2022-09-02: qty 30, 30d supply, fill #0

## 2022-09-02 NOTE — Progress Notes (Signed)
PROGRESS NOTE   Subjective/Complaints: Pt in good spirits. C/o tightness at right neck incision as well as pain along right scalp.    Review of Systems  Constitutional:  Negative for chills and fever.  HENT:  Negative for congestion.   Eyes:  Negative for blurred vision, double vision and photophobia.  Respiratory:  Negative for shortness of breath.   Cardiovascular:  Negative for palpitations.       Chest wall pain  Gastrointestinal:  Negative for abdominal pain, constipation, diarrhea, heartburn, nausea and vomiting.  Genitourinary: Negative.   Musculoskeletal:  Positive for myalgias and neck pain.  Neurological:  Positive for headaches. Negative for weakness.    Objective:   No results found. Recent Labs    08/31/22 0847  WBC 7.9  HGB 11.4*  HCT 34.5*  PLT 333    Recent Labs    08/31/22 0847  NA 136  K 3.5  CL 98  CO2 26  GLUCOSE 115*  BUN 19  CREATININE 0.65  CALCIUM 10.0     Intake/Output Summary (Last 24 hours) at 09/02/2022 0800 Last data filed at 09/02/2022 0754 Gross per 24 hour  Intake 476 ml  Output --  Net 476 ml         Physical Exam: Vital Signs Blood pressure (!) 140/74, pulse 75, temperature 97.9 F (36.6 C), resp. rate 16, height 5\' 2"  (1.575 m), weight 67.4 kg, last menstrual period 07/05/2020, SpO2 99 %.   Constitutional: No distress . Vital signs reviewed. HEENT: NCAT, EOMI, oral membranes moist Neck: supple Cardiovascular: RRR without murmur. No JVD    Respiratory/Chest: CTA Bilaterally without wheezes or rales. Normal effort    GI/Abdomen: BS +, non-tender, non-distended Ext: no clubbing, cyanosis, or edema Psych: pleasant and cooperative  Skin: right neck incision cdi with glue. No wound on scalp Neuro:  CN 2-12 grossly intact. Follows commands. Able to answer most questions without difficulty.  Strength 5/5 b/l shoulder abduction, elbow flex and extension Right UE  4-/5 grip. LUE finger flexion 5/5 Strength 4/5 proximal b/l LE, 5/5 distal LE bilaterally Musculoskeletal:  Decreased Neck ROM in all directions. Discomfort at base of neck. TLSO in place   Assessment/Plan: 1. Functional deficits which require 3+ hours per day of interdisciplinary therapy in a comprehensive inpatient rehab setting. Physiatrist is providing close team supervision and 24 hour management of active medical problems listed below. Physiatrist and rehab team continue to assess barriers to discharge/monitor patient progress toward functional and medical goals  Care Tool:  Bathing    Body parts bathed by patient: Right arm, Face, Chest, Abdomen, Front perineal area, Buttocks, Right upper leg, Right lower leg, Left lower leg, Left upper leg, Left arm         Bathing assist Assist Level: Independent     Upper Body Dressing/Undressing Upper body dressing   What is the patient wearing?: Pull over shirt, Orthosis Orthosis activity level: Performed by helper  Upper body assist Assist Level: Independent with assistive device    Lower Body Dressing/Undressing Lower body dressing      What is the patient wearing?: Underwear/pull up, Pants     Lower body assist Assist for lower  body dressing: Independent with assitive device     Toileting Toileting    Toileting assist Assist for toileting: Independent     Transfers Chair/bed transfer  Transfers assist     Chair/bed transfer assist level: Independent     Locomotion Ambulation   Ambulation assist      Assist level: Independent Assistive device: No Device Max distance: 1000 feet   Walk 10 feet activity   Assist     Assist level: Independent Assistive device: No Device   Walk 50 feet activity   Assist    Assist level: Independent Assistive device: No Device    Walk 150 feet activity   Assist    Assist level: Independent Assistive device: No Device    Walk 10 feet on uneven surface   activity   Assist     Assist level: Independent Assistive device: Other (comment) (No AD)   Wheelchair     Assist Is the patient using a wheelchair?: Yes Type of Wheelchair: Manual    Wheelchair assist level: Independent Max wheelchair distance: 300 feet    Wheelchair 50 feet with 2 turns activity    Assist        Assist Level: Independent   Wheelchair 150 feet activity     Assist      Assist Level: Independent   Blood pressure (!) 140/74, pulse 75, temperature 97.9 F (36.6 C), resp. rate 16, height 5\' 2"  (1.575 m), weight 67.4 kg, last menstrual period 07/05/2020, SpO2 99 %.  Medical Problem List and Plan: 1. 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 F/u with [facility], surgery 2.  Antithrombotics: -DVT/anticoagulation:  Pharmaceutical: Lovenox             -antiplatelet therapy: N/A 3. Pain Management:  Completed 5 days Ketorolac  --decreased tylenol to 650  mg QID prn, robaxin 1000mg  TID, Tramadol 50mg  PRN -will add addnl lidocaine patches so that she can use on neck 4. Mood/Behavior/Sleep: LCSW to follow for evaluation and support.              -antipsychotic agents: N/A 5. Neuropsych/cognition: This patient is capable of making decisions on her own behalf. 6. Skin/Wound Care: reassured pt about neck wound. Nothing to see on scalp 7. Fluids/Electrolytes/Nutrition: Monitor I/O. Intake has been reasonable.              --family brings food from home.  8. Unstable Hangman's Fx s/p ACDF C2/C3: Cervical precautions. No cervical collar needed. 9. T12 and L3 vertebral body Fx: TLSO done at EOB. Do not remove for shower.             --back precautions. F/u with Dr. Zada Finders as outpatient 10.  Pre-diabetes: Hgb A1C-5.8 with impaired fasting glucose. CBGs well controlled overall. --Has been managed  w/diet modifications at home. Add CM restrictions.  11. B- pulmonary contusion/L 3-9th rib fx: Sternal precautions. Lidocaine patch --Encourage pulmonary hygeine. Flutter valve every 4 h.  12. ABLA: Hgb down from 14.0-->10 --Will monitor H/H with serial checks.  13. HTN             -Continue norvasc 10mg  daily, HCTZ 12.5 mg daily  -9/20 metoprolol 12.5 BID started  9/21 elevated BP, increase metoprolol to 25mg  BID  9/22  increased HCTZ to 25mg  daily  9/23 observe for response to these changes. Pain component  9/26 HCTZ increased to 50mg  daily  9/27 bp better controlled.  14. Hypokalemia  -kcl ordered, up to 3.3 9/22, continue KCL 10/27 BID today,recheck labs  -K+ up to 3.5 9/25, continue KCL daily, recheck with next routine labs 15. Elevated LFTs, ALT and AST still a little up, Tbili now WNL  -recheck Monday, discussed with pharmacy, will change tylenol to PRN   -AST down to 32, ALT down to 61 on 9/25, improved, recheck with next routine labs  LOS: 8 days A FACE TO FACE EVALUATION WAS PERFORMED  Saturday 09/02/2022, 8:00 AM

## 2022-09-02 NOTE — Discharge Instructions (Addendum)
Inpatient Rehab Discharge Instructions  Jessica Andrade Discharge date and time:    Activities/Precautions/ Functional Status: Activity: no lifting, driving, or strenuous exercise till cleared by MD Diet: diabetic diet Wound Care: none needed   Functional status:  ___ No restrictions     ___ Walk up steps independently ___ 24/7 supervision/assistance   ___ Walk up steps with assistance _X__ Intermittent supervision/assistance  ___ Bathe/dress independently ___ Walk with walker     _X__ Bathe/dress with assistance ___ Walk Independently    ___ Shower independently ___ Walk with assistance    ___ Shower with assistance _X__ No alcohol     ___ Return to work/school ________   Special Instructions: Back brace has to be put on at edge of bed and before getting up or moving. No bending, twisting or arching. No lifting items over 5 lbs. Dr. Venetia Constable will be giving information on brace restrictions.    COMMUNITY REFERRALS UPON DISCHARGE:    Home Health:   [facility] Phone: [phone]   Medical Equipment/Items Ordered:None needed                                                 Agency/Supplier:NA    My questions have been answered and I understand these instructions. I will adhere to these goals and the provided educational materials after my discharge from the hospital.  Patient/Caregiver Signature _______________________________ Date __________  Clinician Signature _______________________________________ Date __________  Please bring this form and your medication list with you to all your follow-up doctor's appointments.
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
## 2022-09-02 NOTE — Progress Notes (Signed)
Inpatient Rehabilitation Discharge Medication Review by a Pharmacist  A complete drug regimen review was completed for this patient to identify any potential clinically significant medication issues.  High Risk Drug Classes Is patient taking? Indication by Medication  Antipsychotic No   Anticoagulant No   Antibiotic No   Opioid Yes Tramadol prn pain  Antiplatelet No   Hypoglycemics/insulin No   Vasoactive Medication Yes Amlodipine, HCTZ, metoprolol - BP  Chemotherapy No   Other Yes Pantoprazole - Reflux Lidocaine patch - pain Methocarbamol - spasms Potassium - supplement     Type of Medication Issue Identified Description of Issue Recommendation(s)  Drug Interaction(s) (clinically significant)     Duplicate Therapy     Allergy     No Medication Administration End Date     Incorrect Dose     Additional Drug Therapy Needed     Significant med changes from prior encounter (inform family/care partners about these prior to discharge).    Other       Clinically significant medication issues were identified that warrant physician communication and completion of prescribed/recommended actions by midnight of the next day:  No  Name of provider notified for urgent issues identified:   Provider Method of Notification:     Pharmacist comments: None  Time spent performing this drug regimen review (minutes):  30 minutes   Tad Moore 09/02/2022 10:56 AM

## 2022-09-07 ENCOUNTER — Inpatient Hospital Stay: Payer: Commercial Managed Care - HMO

## 2022-09-08 ENCOUNTER — Ambulatory Visit (INDEPENDENT_AMBULATORY_CARE_PROVIDER_SITE_OTHER): Payer: Commercial Managed Care - HMO | Admitting: Family Medicine

## 2022-09-08 DIAGNOSIS — T1490XA Injury, unspecified, initial encounter: Secondary | ICD-10-CM

## 2022-09-08 DIAGNOSIS — E876 Hypokalemia: Secondary | ICD-10-CM | POA: Diagnosis not present

## 2022-09-08 NOTE — Patient Instructions (Addendum)
It was great seeing you today!  You came in for follow up from your recent hospitalization   We are checking blood work to make sure your potassium is ok.  I will call you if anything is abnormal, or we will send a letter if normal.  Neurosurgeon information: we called and left message with your information you should hear back from them but if not please call us at the clinic so we can check on that  Emelda Brothers, MD, [address], [place], [place] [address]  [phone]  Feel free to call with any questions or concerns at any time, at [phone].   Take care,  Dr. Shary Key [facility]
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
## 2022-09-09 LAB — CBC
Hematocrit: 37.9 % (ref 34.0–46.6)
Hemoglobin: 12.7 g/dL (ref 11.1–15.9)
MCH: 28.9 pg (ref 26.6–33.0)
MCHC: 33.5 g/dL (ref 31.5–35.7)
MCV: 86 fL (ref 79–97)
Platelets: 439 10*3/uL (ref 150–450)
RBC: 4.4 x10E6/uL (ref 3.77–5.28)
RDW: 14.5 % (ref 11.7–15.4)
WBC: 6.7 10*3/uL (ref 3.4–10.8)

## 2022-09-09 LAB — BASIC METABOLIC PANEL
BUN/Creatinine Ratio: 28 — ABNORMAL HIGH (ref 9–23)
BUN: 18 mg/dL (ref 6–24)
CO2: 26 mmol/L (ref 20–29)
Calcium: 10.4 mg/dL — ABNORMAL HIGH (ref 8.7–10.2)
Chloride: 96 mmol/L (ref 96–106)
Creatinine, Ser: 0.65 mg/dL (ref 0.57–1.00)
Glucose: 133 mg/dL — ABNORMAL HIGH (ref 70–99)
Potassium: 3.6 mmol/L (ref 3.5–5.2)
Sodium: 140 mmol/L (ref 134–144)
eGFR: 107 mL/min/{1.73_m2} (ref >59)

## 2022-09-10 ENCOUNTER — Encounter: Payer: Self-pay | Admitting: Family Medicine

## 2022-09-25 ENCOUNTER — Encounter
Payer: Commercial Managed Care - HMO | Attending: Physical Medicine & Rehabilitation | Admitting: Physical Medicine & Rehabilitation

## 2022-09-25 ENCOUNTER — Encounter: Payer: Self-pay | Admitting: Physical Medicine & Rehabilitation

## 2022-09-25 VITALS — BP 126/83 | HR 78 | Ht 62.0 in | Wt 148.8 lb

## 2022-09-25 DIAGNOSIS — E876 Hypokalemia: Secondary | ICD-10-CM

## 2022-09-25 DIAGNOSIS — S12130S Unspecified traumatic displaced spondylolisthesis of second cervical vertebra, sequela: Secondary | ICD-10-CM

## 2022-09-25 DIAGNOSIS — M542 Cervicalgia: Secondary | ICD-10-CM

## 2022-09-25 DIAGNOSIS — S32009A Unspecified fracture of unspecified lumbar vertebra, initial encounter for closed fracture: Secondary | ICD-10-CM

## 2022-09-25 DIAGNOSIS — I10 Essential (primary) hypertension: Secondary | ICD-10-CM | POA: Diagnosis not present

## 2022-09-25 MED ORDER — GABAPENTIN 100 MG PO CAPS: 100.0000 mg | ORAL_CAPSULE | Freq: Three times a day (TID) | ORAL | 3 refills | Status: DC
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
## 2022-10-01 ENCOUNTER — Encounter: Payer: Self-pay | Admitting: Physical Therapy

## 2022-10-01 ENCOUNTER — Ambulatory Visit: Payer: Commercial Managed Care - HMO | Attending: Family Medicine | Admitting: Physical Therapy

## 2022-10-01 ENCOUNTER — Other Ambulatory Visit: Payer: Self-pay

## 2022-10-01 DIAGNOSIS — M542 Cervicalgia: Secondary | ICD-10-CM | POA: Diagnosis present

## 2022-10-01 DIAGNOSIS — M6281 Muscle weakness (generalized): Secondary | ICD-10-CM | POA: Diagnosis present
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
## 2022-10-01 NOTE — Patient Instructions (Signed)
Access Code: [email or web address] URL: [email or web address] Date: 10/01/2022 Prepared by: Hilda Blades  Exercises - Cervical Extension AROM with Strap  - 2 x daily - 2 sets - 10 reps - Seated Assisted Cervical Rotation with Towel  - 2 x daily - 2 sets - 10 reps - Seated Scapular Retraction  - 2 x daily - 2 sets - 10 reps - 5 seconds hold

## 2022-10-06 ENCOUNTER — Telehealth: Payer: Self-pay

## 2022-10-06 ENCOUNTER — Other Ambulatory Visit: Payer: Self-pay | Admitting: Family Medicine
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
## 2022-10-08 ENCOUNTER — Ambulatory Visit: Payer: Commercial Managed Care - HMO | Attending: Family Medicine

## 2022-10-08 DIAGNOSIS — M6281 Muscle weakness (generalized): Secondary | ICD-10-CM

## 2022-10-08 DIAGNOSIS — M542 Cervicalgia: Secondary | ICD-10-CM | POA: Diagnosis not present
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
## 2022-10-09 NOTE — Telephone Encounter (Signed)
Message sent to [employer] for processing.  

## 2022-10-12 ENCOUNTER — Encounter: Payer: Self-pay | Admitting: Family Medicine

## 2022-10-12 ENCOUNTER — Ambulatory Visit (INDEPENDENT_AMBULATORY_CARE_PROVIDER_SITE_OTHER): Payer: Commercial Managed Care - HMO | Admitting: Family Medicine

## 2022-10-12 DIAGNOSIS — I951 Orthostatic hypotension: Secondary | ICD-10-CM | POA: Diagnosis not present

## 2022-10-12 NOTE — Patient Instructions (Signed)
It was great seeing you today!  Today we had a follow up, I am glad that you are doing better. It is a good sign that you are improving, this will all take time to heal. Please make sure to follow up with the neurosurgeon in a few weeks at your scheduled appointment.   I think your blood pressures are getting a little low when you change positions, especially when you stand. Please make sure to drink 6-8 glasses of water a day. Make sure to have a glass of water at your night stand, drink this each morning before you stand up each morning to get out of bed. Make sure to move positions very slowly. Continue with physical therapy because this will help you continue to get stronger.   Please follow up at your next scheduled appointment in 1 month, if anything arises between now and then, please don't hesitate to contact our office.   Thank you for allowing [place] to be a part of your medical care!  Thank you, Dr. Larae Grooms  Also a reminder of our clinic's no-show policy. Please make sure to arrive at least 15 minutes prior to your scheduled appointment time. Please try to cancel before 24 hours if you are not able to make it. If you no-show for 2 appointments then you will be receiving a warning letter. If you no-show after 3 visits, then you may be at risk of being dismissed from our clinic. This is to ensure that everyone is able to be seen in a timely manner. Thank you, we appreciate your assistance with this!
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
## 2022-10-12 NOTE — Assessment & Plan Note (Signed)
-  patient seems to be improving s/p neurosurgical intervention, neurological exam unremarkable -pain well-controlled  -maintain scheduled  neurolosurgical outpatient follow up  -follow up in 1 month with PCP

## 2022-10-12 NOTE — Assessment & Plan Note (Signed)
-  orthostatics positive  -encouraged appropriate hydration

## 2022-10-14 ENCOUNTER — Ambulatory Visit: Payer: Commercial Managed Care - HMO | Admitting: Physical Therapy

## 2022-10-14 ENCOUNTER — Ambulatory Visit: Payer: Commercial Managed Care - HMO

## 2022-10-14 DIAGNOSIS — M542 Cervicalgia: Secondary | ICD-10-CM

## 2022-10-14 DIAGNOSIS — M6281 Muscle weakness (generalized): Secondary | ICD-10-CM
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
## 2022-10-21 ENCOUNTER — Ambulatory Visit: Payer: Commercial Managed Care - HMO

## 2022-10-21 DIAGNOSIS — M6281 Muscle weakness (generalized): Secondary | ICD-10-CM

## 2022-10-21 DIAGNOSIS — M542 Cervicalgia: Secondary | ICD-10-CM | POA: Diagnosis not present
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
## 2022-10-27 ENCOUNTER — Ambulatory Visit: Payer: Commercial Managed Care - HMO

## 2022-10-27 DIAGNOSIS — M6281 Muscle weakness (generalized): Secondary | ICD-10-CM

## 2022-10-27 DIAGNOSIS — M542 Cervicalgia: Secondary | ICD-10-CM | POA: Diagnosis not present
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
## 2022-11-05 ENCOUNTER — Ambulatory Visit: Payer: Commercial Managed Care - HMO

## 2022-11-05 DIAGNOSIS — M6281 Muscle weakness (generalized): Secondary | ICD-10-CM

## 2022-11-05 DIAGNOSIS — M542 Cervicalgia: Secondary | ICD-10-CM | POA: Diagnosis not present

## 2022-11-05 NOTE — Patient Instructions (Signed)

## 2022-11-11 ENCOUNTER — Ambulatory Visit: Payer: Commercial Managed Care - HMO | Attending: Family Medicine

## 2022-11-11 DIAGNOSIS — M542 Cervicalgia: Secondary | ICD-10-CM | POA: Diagnosis present

## 2022-11-11 DIAGNOSIS — M6281 Muscle weakness (generalized): Secondary | ICD-10-CM | POA: Diagnosis present
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
## 2022-11-12 ENCOUNTER — Ambulatory Visit: Payer: Commercial Managed Care - HMO

## 2022-11-13 ENCOUNTER — Telehealth: Payer: Self-pay | Admitting: Family Medicine

## 2022-11-13 NOTE — Telephone Encounter (Signed)
Received call from patient's husband. Patient has been having headaches. Discussed she should be seen by PCP or another physician. Front Team please call patient and schedule a visit with any provider.  Best number is for husband--Osman Lujean Amel, MD  [facility] Medicine Teaching Service

## 2022-11-19 ENCOUNTER — Ambulatory Visit: Payer: Commercial Managed Care - HMO

## 2022-11-19 ENCOUNTER — Ambulatory Visit (INDEPENDENT_AMBULATORY_CARE_PROVIDER_SITE_OTHER): Payer: Commercial Managed Care - HMO | Admitting: Family Medicine

## 2022-11-19 ENCOUNTER — Encounter: Payer: Self-pay | Admitting: Family Medicine

## 2022-11-19 ENCOUNTER — Other Ambulatory Visit: Payer: Self-pay

## 2022-11-19 VITALS — BP 150/88 | HR 89 | Wt 149.2 lb

## 2022-11-19 DIAGNOSIS — I609 Nontraumatic subarachnoid hemorrhage, unspecified: Secondary | ICD-10-CM | POA: Diagnosis not present

## 2022-11-19 DIAGNOSIS — I10 Essential (primary) hypertension: Secondary | ICD-10-CM

## 2022-11-19 DIAGNOSIS — M542 Cervicalgia: Secondary | ICD-10-CM | POA: Diagnosis not present

## 2022-11-19 DIAGNOSIS — Z0000 Encounter for general adult medical examination without abnormal findings: Secondary | ICD-10-CM | POA: Diagnosis not present

## 2022-11-19 DIAGNOSIS — M6281 Muscle weakness (generalized): Secondary | ICD-10-CM
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
## 2022-11-19 NOTE — Assessment & Plan Note (Signed)
Discussed recommendation for colonoscopy, she declines.

## 2022-11-19 NOTE — Assessment & Plan Note (Addendum)
BP elevated today x2 checks. Discussed BP goal and encouraged home BP checks. If persistently elevated would recommend medication adjustment though she does have hx of positive orthostatic BP so would have to weigh risks and benefits. F/u in 2 weeks for BP check.
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
## 2022-11-19 NOTE — Patient Instructions (Addendum)
It was wonderful to see you today.  Please bring ALL of your medications with you to every visit.   Today we talked about:  If you start to develop headaches, vision troubles, feel off balance those are all reasons to seek medical attention.  You can try heading pads. You can take Tylenol or Ibuprofen for your pain. If you change your mind about the Gabapentin, let me know. If your pain worsens, please return.   Thank you for coming to your visit as scheduled. We have had a large "no-show" problem lately, and this significantly limits our ability to see and care for patients. As a friendly reminder- if you cannot make your appointment please call to cancel. We do have a no show policy for those who do not cancel within 24 hours. Our policy is that if you miss or fail to cancel an appointment within 24 hours, 3 times in a 16-month period, you may be dismissed from our clinic.   Thank you for choosing [facility].   Please call [phone] with any questions about today's appointment.  Please be sure to schedule follow up at the front  desk before you leave today.   Sabino Dick, DO PGY-3 Family Medicine
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
## 2022-11-26 ENCOUNTER — Ambulatory Visit: Payer: Commercial Managed Care - HMO

## 2022-11-26 DIAGNOSIS — M542 Cervicalgia: Secondary | ICD-10-CM

## 2022-11-26 DIAGNOSIS — M6281 Muscle weakness (generalized): Secondary | ICD-10-CM

## 2022-11-27 ENCOUNTER — Encounter: Payer: Self-pay | Admitting: Family Medicine

## 2022-11-27 ENCOUNTER — Ambulatory Visit (INDEPENDENT_AMBULATORY_CARE_PROVIDER_SITE_OTHER): Payer: Commercial Managed Care - HMO | Admitting: Family Medicine

## 2022-11-27 VITALS — BP 135/68 | HR 82 | Ht 62.0 in | Wt 148.6 lb

## 2022-11-27 DIAGNOSIS — I10 Essential (primary) hypertension: Secondary | ICD-10-CM

## 2022-11-27 NOTE — Progress Notes (Signed)
    SUBJECTIVE:   CHIEF COMPLAINT / HPI:   Patient presents for BP check, she reports compliance on her amlodipine 5 mg and HCTZ 12.5 mg daily. Denies chest pain, dyspnea, leg swelling, vision changes and headaches. Has a history of orthostatic hypotension as well which brought about hesitation at the last visit to alter antihypertensive regimen. Home BP ranges around what it is today.  Trying to eat a balanced diet, used to go walking but now with the cold she does this less often.   OBJECTIVE:   BP 135/68   Pulse 82   Ht 5\' 2"  (1.575 m)   Wt 148 lb 9.6 oz (67.4 kg)   LMP 07/05/2020   SpO2 100%   BMI 27.18 kg/m   General: Patient well-appearing, in no acute distress. CV: RRR, no murmurs or gallops auscultated  Resp: CTAB, no wheezing, rales or rhonchi noted Ext: no LE edema noted bilaterally   ASSESSMENT/PLAN:   Essential hypertension -BP 135/68, at goal -continue current antihypertensive regimen of amlodipine 5 mg and HCTZ 12.5 mg daily -diet and exercise counseling provided -ED precautions discussed -follow up in 4 months     -PHQ-9 score of 0 reviewed.    07/07/2020, DO [place] [facility]

## 2022-11-27 NOTE — Patient Instructions (Addendum)
It was great seeing you today!  Today we discussed your blood pressure which looks great! Please continue to take all your medications, no changes were made today. Please make sure to stay hydrated, drink 6-8 glasses of water a day. Make sure to get up and change positions slowly when moving.   Continue to eat a balanced diet. Try to stay active, this includes working out even in the house when it gets cold during the winter season.  Please follow up at your next scheduled appointment in 4 months, if anything arises between now and then, please don't hesitate to contact our office.   Thank you for allowing [place] to be a part of your medical care!  Thank you, Dr. Robyne Peers  Also a reminder of our clinic's no-show policy. Please make sure to arrive at least 15 minutes prior to your scheduled appointment time. Please try to cancel before 24 hours if you are not able to make it. If you no-show for 2 appointments then you will be receiving a warning letter. If you no-show after 3 visits, then you may be at risk of being dismissed from our clinic. This is to ensure that everyone is able to be seen in a timely manner. Thank you, we appreciate your assistance with this!

## 2022-11-27 NOTE — Assessment & Plan Note (Signed)
-  BP 135/68, at goal -continue current antihypertensive regimen of amlodipine 5 mg and HCTZ 12.5 mg daily -diet and exercise counseling provided -ED precautions discussed -follow up in 4 months

## 2022-12-08 ENCOUNTER — Ambulatory Visit (HOSPITAL_COMMUNITY)
Admission: EM | Admit: 2022-12-08 | Discharge: 2022-12-08 | Disposition: A | Discharge status: Home / Self Care | Payer: Medicaid Other | Attending: Nurse Practitioner | Admitting: Nurse Practitioner

## 2022-12-08 ENCOUNTER — Encounter (HOSPITAL_COMMUNITY): Payer: Self-pay

## 2022-12-08 ENCOUNTER — Ambulatory Visit (INDEPENDENT_AMBULATORY_CARE_PROVIDER_SITE_OTHER): Payer: Medicaid Other

## 2022-12-08 ENCOUNTER — Emergency Department (HOSPITAL_COMMUNITY)
Admission: EM | Admit: 2022-12-08 | Discharge: 2022-12-08 | Discharge status: Against Medical Advice | Payer: Commercial Managed Care - HMO

## 2022-12-08 DIAGNOSIS — M533 Sacrococcygeal disorders, not elsewhere classified: Secondary | ICD-10-CM

## 2022-12-08 DIAGNOSIS — M545 Low back pain, unspecified: Secondary | ICD-10-CM

## 2022-12-08 NOTE — ED Notes (Signed)
Per other visitors in lobby, pt noted to have walked out of the department with her visitor. Pt not noted anywhere in the lobby.
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
## 2022-12-09 DIAGNOSIS — H04123 Dry eye syndrome of bilateral lacrimal glands: Secondary | ICD-10-CM | POA: Diagnosis not present

## 2022-12-25 NOTE — Telephone Encounter (Signed)
Open in error
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
## 2022-12-30 NOTE — Patient Instructions (Incomplete)
It was nice seeing you today!  Blood work today.  See me in 3 months or whenever is a good for you.  Stay well, Mathias Bogacki, MD [facility] [phone]  --  Make sure to check out at the front desk before you leave today.  Please arrive at least 15 minutes prior to your scheduled appointments.  If you had blood work today, I will send you a MyChart message or a letter if results are normal. Otherwise, I will give you a call.  If you had a referral placed, they will call you to set up an appointment. Please give us a call if you don't hear back in the next 2 weeks.  If you need additional refills before your next appointment, please call your pharmacy first.  

## 2023-01-01 ENCOUNTER — Ambulatory Visit: Payer: Medicaid Other | Admitting: Family Medicine

## 2023-01-01 DIAGNOSIS — Z6825 Body mass index (BMI) 25.0-25.9, adult: Secondary | ICD-10-CM | POA: Diagnosis not present

## 2023-01-01 DIAGNOSIS — M8008XG Age-related osteoporosis with current pathological fracture, vertebra(e), subsequent encounter for fracture with delayed healing: Secondary | ICD-10-CM | POA: Diagnosis not present

## 2023-01-05 ENCOUNTER — Other Ambulatory Visit (HOSPITAL_COMMUNITY): Payer: Self-pay | Admitting: Physician Assistant

## 2023-01-05 DIAGNOSIS — M8008XA Age-related osteoporosis with current pathological fracture, vertebra(e), initial encounter for fracture: Secondary | ICD-10-CM

## 2023-01-09 ENCOUNTER — Ambulatory Visit (HOSPITAL_COMMUNITY)
Admission: RE | Admit: 2023-01-09 | Discharge: 2023-01-09 | Disposition: A | Discharge status: Home / Self Care | Payer: Medicaid Other | Source: Ambulatory Visit | Attending: Physician Assistant | Admitting: Physician Assistant

## 2023-01-09 DIAGNOSIS — M8008XA Age-related osteoporosis with current pathological fracture, vertebra(e), initial encounter for fracture: Secondary | ICD-10-CM | POA: Insufficient documentation

## 2023-01-09 DIAGNOSIS — M545 Low back pain, unspecified: Secondary | ICD-10-CM | POA: Diagnosis not present

## 2023-01-09 DIAGNOSIS — M810 Age-related osteoporosis without current pathological fracture: Secondary | ICD-10-CM | POA: Diagnosis not present

## 2023-01-09 DIAGNOSIS — M4316 Spondylolisthesis, lumbar region: Secondary | ICD-10-CM | POA: Diagnosis not present

## 2023-03-22 ENCOUNTER — Ambulatory Visit (INDEPENDENT_AMBULATORY_CARE_PROVIDER_SITE_OTHER): Payer: Medicaid Other | Admitting: Family Medicine

## 2023-03-22 ENCOUNTER — Encounter: Payer: Self-pay | Admitting: Family Medicine

## 2023-03-22 VITALS — BP 158/75 | HR 80 | Ht 62.0 in | Wt 142.0 lb

## 2023-03-22 DIAGNOSIS — I10 Essential (primary) hypertension: Secondary | ICD-10-CM

## 2023-03-22 DIAGNOSIS — G8929 Other chronic pain: Secondary | ICD-10-CM

## 2023-03-22 DIAGNOSIS — M549 Dorsalgia, unspecified: Secondary | ICD-10-CM | POA: Diagnosis present

## 2023-03-22 NOTE — Assessment & Plan Note (Signed)
-  BP 158/75, remains elevated likely secondary to pain from back pain -continue current antihypertensive regimen of HCTZ and amlodipine  -follow up for BP check, consider increasing amlodipine to 10 mg if BP remains elevated at next visit

## 2023-03-22 NOTE — Patient Instructions (Signed)
It was great seeing you today!  Today we discussed your back pain, I think this is from the narrowing that we discussed of the spine that is causing your pain. I think physical therapy will be beneficial for you. You should hear from then within 2 weeks, if you do not then please reach out to our office with assistance with scheduling.   Please follow up at your next scheduled appointment in 2 months, if anything arises between now and then, please don't hesitate to contact our office.   Thank you for allowing [place] to be a part of your medical care!  Thank you, Dr. Robyne Peers  Also a reminder of our clinic's no-show policy. Please make sure to arrive at least 15 minutes prior to your scheduled appointment time. Please try to cancel before 24 hours if you are not able to make it. If you no-show for 2 appointments then you will be receiving a warning letter. If you no-show after 3 visits, then you may be at risk of being dismissed from our clinic. This is to ensure that everyone is able to be seen in a timely manner. Thank you, we appreciate your assistance with this!
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
## 2023-04-10 ENCOUNTER — Ambulatory Visit: Payer: Medicaid Other | Attending: Family Medicine

## 2023-05-01 IMAGING — MG DIGITAL DIAGNOSTIC BILAT W/ TOMO W/ CAD
6 of 12 series · 6 of 36 positions shown · non-contrast
Comparison: 07/03/2021

CLINICAL DATA: Patient returns after baseline screening for
evaluation possible asymmetries in both breasts.

EXAM:
DIGITAL DIAGNOSTIC BILATERAL MAMMOGRAM WITH TOMOSYNTHESIS AND CAD;
ULTRASOUND LEFT BREAST LIMITED
TECHNIQUE: Bilateral digital diagnostic mammography and breast tomosynthesis
was performed. The images were evaluated with computer-aided
detection.; Targeted ultrasound examination of the left breast was
performed.

[R MLO synth-2D]
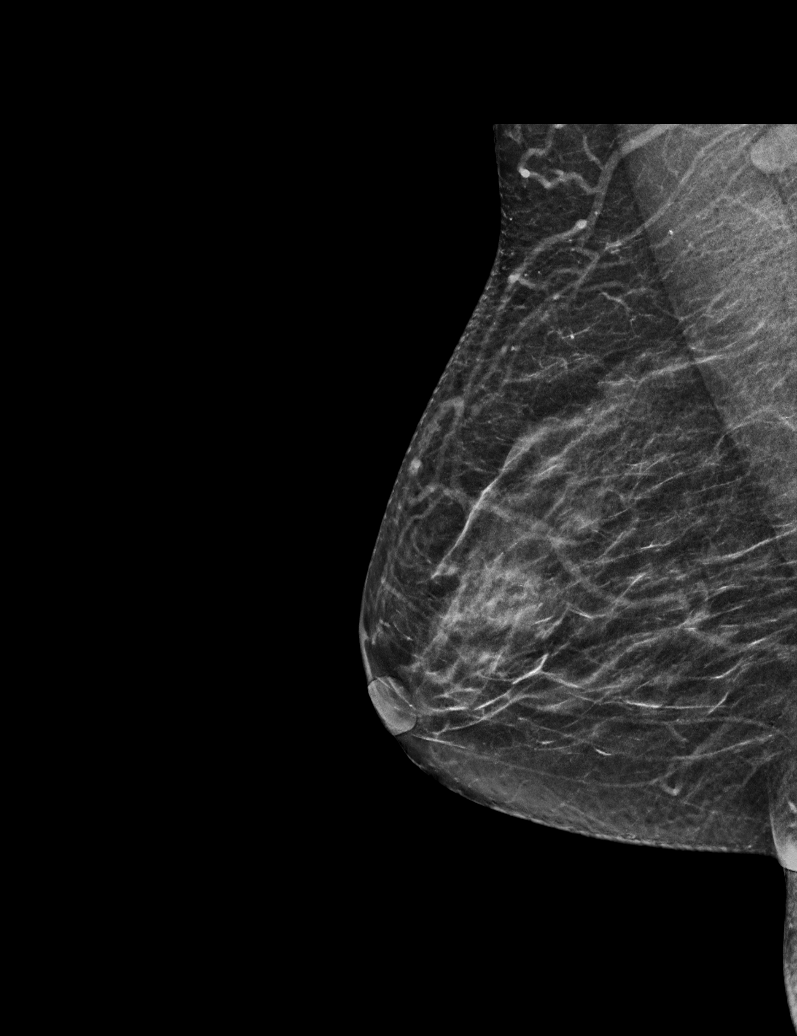

[L MLO synth-2D (1 of 2)]
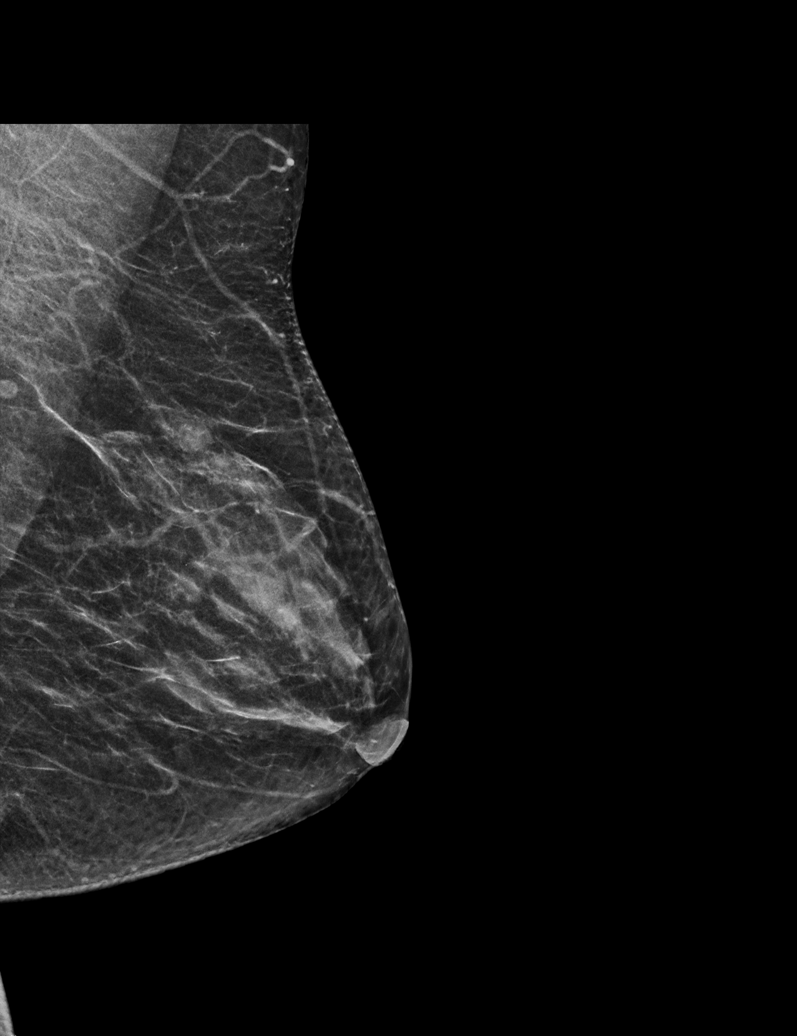

[L MLO synth-2D (2 of 2)]
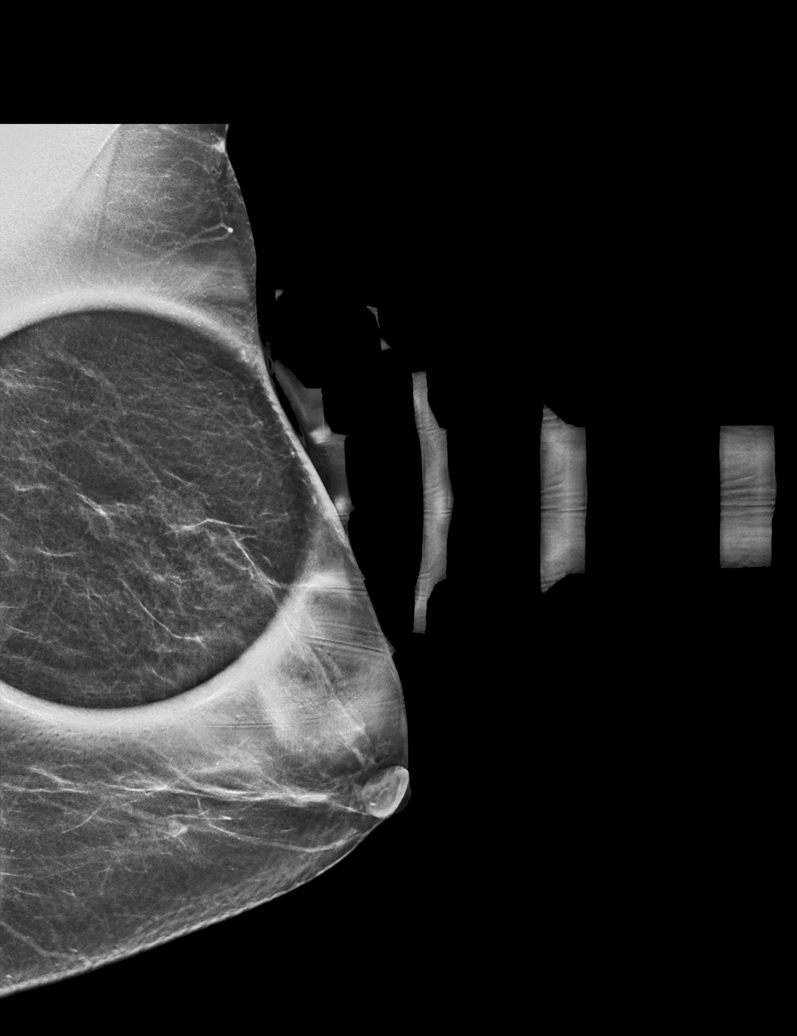

[L CC synth-2D (1 of 2)]
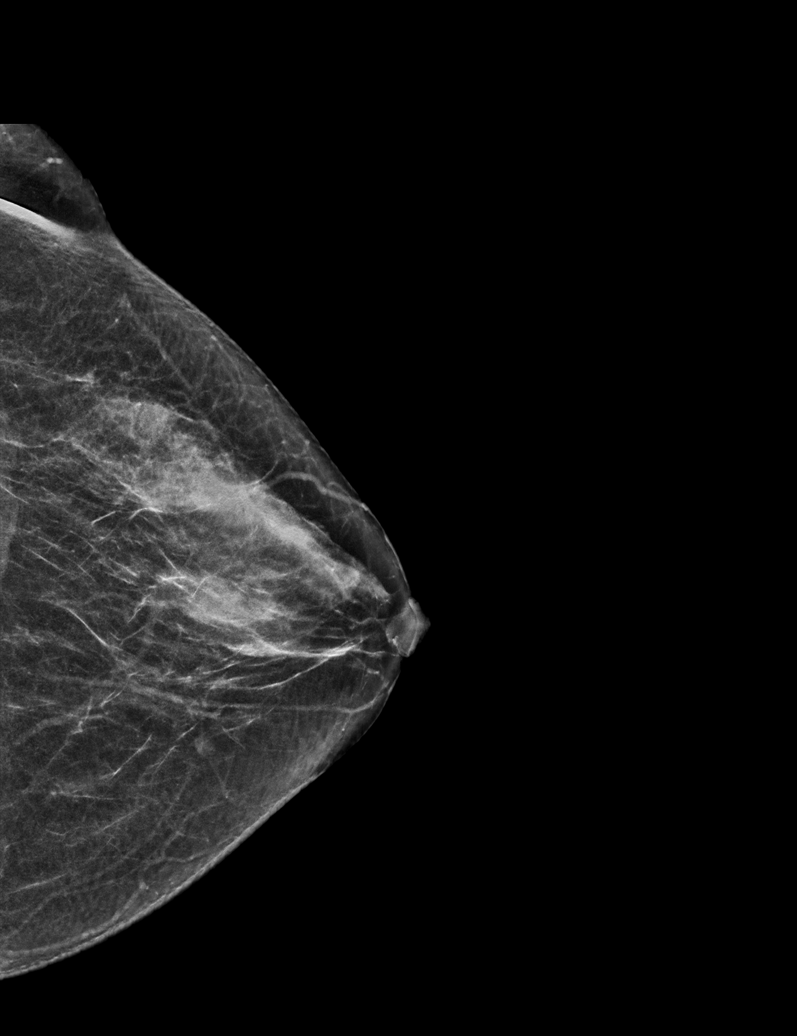

[L CC synth-2D (2 of 2)]
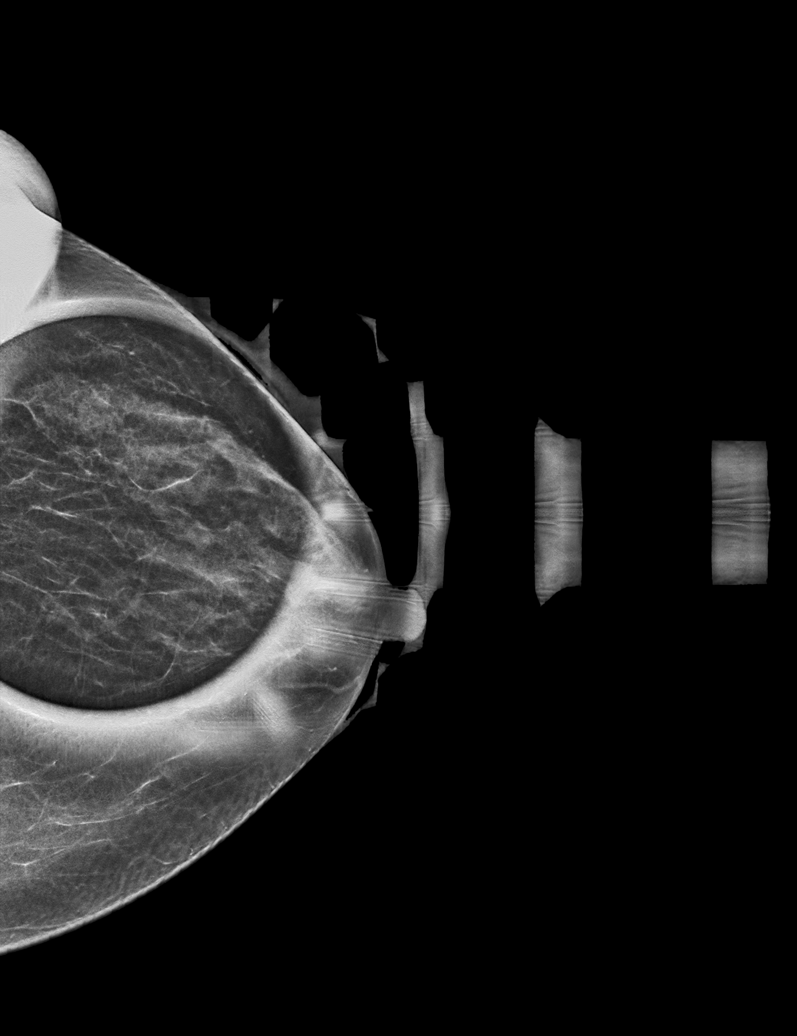

[R CC synth-2D]
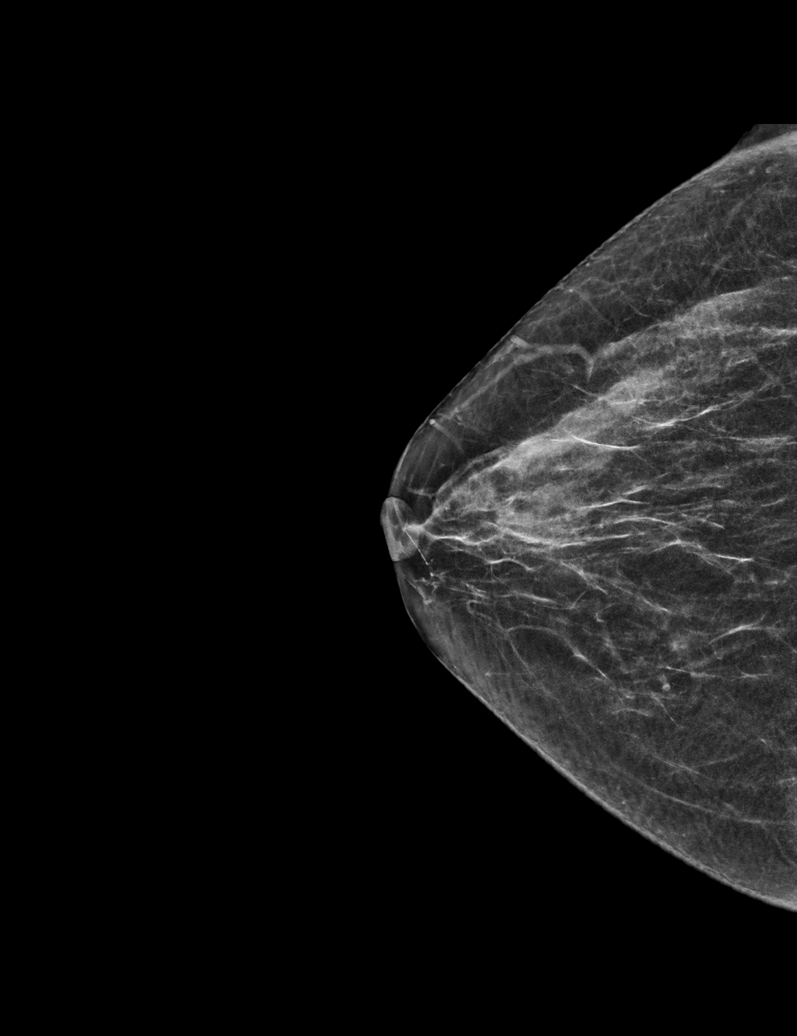

[6 of 36 positions shown; findings below may reference images not displayed]

ACR Breast Density Category c: The breast tissue is heterogeneously
dense, which may obscure small masses.
FINDINGS: 2-D and 3-D images are performed. These views show no persistent
abnormality in the MEDIAL aspect of the RIGHT breast.

Spot compression views are also performed of the UPPER-OUTER
QUADRANT of the LEFT breast and show a partially obscured oval mass
in the UPPER-OUTER QUADRANT of the LEFT breast.

On physical exam, I palpate no discrete mass in the UPPER-OUTER
QUADRANT of the LEFT breast.

Targeted ultrasound is performed, showing a circumscribed oval
hypoechoic mass two o'clock location of the LEFT breast 5
centimeters from the nipple measuring 0.8 x 0.6 x 0.3 centimeters
and correlates with the mammogram.

In the 1 o'clock location 3 centimeters from the nipple, a similar
oval circumscribed hypoechoic mass is 0.8 x 0.6 x 0.4 centimeters
and is incidental.
IMPRESSION: 1. No persistent abnormality in the RIGHT breast.
2. Two probably benign fibroadenomas in the UPPER-OUTER QUADRANT of
the LEFT breast.

RECOMMENDATION:
Recommend LEFT breast ultrasound in 6 months to assess stability of
probably benign masses.

I have discussed the findings and recommendations with the patient.
If applicable, a reminder letter will be sent to the patient
regarding the next appointment.

BI-RADS CATEGORY  3: Probably benign.

## 2023-06-19 ENCOUNTER — Emergency Department (HOSPITAL_COMMUNITY)
Admission: EM | Admit: 2023-06-19 | Discharge: 2023-06-19 | Disposition: A | Discharge status: Home / Self Care | Payer: Medicaid Other | Attending: Emergency Medicine | Admitting: Emergency Medicine

## 2023-06-19 ENCOUNTER — Encounter (HOSPITAL_COMMUNITY): Payer: Self-pay

## 2023-06-19 ENCOUNTER — Other Ambulatory Visit: Payer: Self-pay

## 2023-06-19 ENCOUNTER — Emergency Department (HOSPITAL_COMMUNITY): Payer: Medicaid Other

## 2023-06-19 DIAGNOSIS — S199XXA Unspecified injury of neck, initial encounter: Secondary | ICD-10-CM | POA: Diagnosis not present

## 2023-06-19 DIAGNOSIS — W19XXXA Unspecified fall, initial encounter: Secondary | ICD-10-CM

## 2023-06-19 DIAGNOSIS — S0992XA Unspecified injury of nose, initial encounter: Secondary | ICD-10-CM | POA: Diagnosis present

## 2023-06-19 DIAGNOSIS — S0990XA Unspecified injury of head, initial encounter: Secondary | ICD-10-CM | POA: Diagnosis not present

## 2023-06-19 DIAGNOSIS — S0031XA Abrasion of nose, initial encounter: Secondary | ICD-10-CM | POA: Insufficient documentation

## 2023-06-19 DIAGNOSIS — Z79899 Other long term (current) drug therapy: Secondary | ICD-10-CM | POA: Diagnosis not present

## 2023-06-19 DIAGNOSIS — S0081XA Abrasion of other part of head, initial encounter: Secondary | ICD-10-CM | POA: Diagnosis not present

## 2023-06-19 DIAGNOSIS — W010XXA Fall on same level from slipping, tripping and stumbling without subsequent striking against object, initial encounter: Secondary | ICD-10-CM | POA: Diagnosis not present

## 2023-06-19 MED ORDER — BACITRACIN ZINC 500 UNIT/GM EX OINT: TOPICAL_OINTMENT | Freq: Two times a day (BID) | CUTANEOUS | Status: AC

## 2023-06-19 MED ORDER — BACITRACIN ZINC 500 UNIT/GM EX OINT
TOPICAL_OINTMENT | CUTANEOUS | Status: AC
  Filled 2023-06-19: qty 0.9
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
## 2023-06-19 NOTE — Discharge Instructions (Addendum)
You were seen in the emergency department following a fall. Your CT scans were negative for any fractures or dislocations or movement of the screw in your neck. I would advise managing your pain with OTC options such as Tylenol, ibuprofen, or Aleve. Monitor the area of skin that was abraised for any signs of infection. Keep that area clean by washing it twice daily and keeping it dry afterwards. If concerned that symptoms are worsening, return to the ER.

## 2023-06-19 NOTE — ED Notes (Signed)
Spoke with CT about patient, and she is 5th on the list at the moment

## 2023-06-24 DIAGNOSIS — H04123 Dry eye syndrome of bilateral lacrimal glands: Secondary | ICD-10-CM | POA: Diagnosis not present

## 2023-07-02 ENCOUNTER — Encounter: Payer: Self-pay | Admitting: Student

## 2023-07-02 ENCOUNTER — Ambulatory Visit (INDEPENDENT_AMBULATORY_CARE_PROVIDER_SITE_OTHER): Payer: Medicaid Other | Admitting: Student

## 2023-07-02 VITALS — BP 131/82 | HR 82 | Ht 62.0 in | Wt 145.4 lb

## 2023-07-02 DIAGNOSIS — M792 Neuralgia and neuritis, unspecified: Secondary | ICD-10-CM | POA: Diagnosis not present

## 2023-07-02 NOTE — Patient Instructions (Signed)
It was great to see you! Thank you for allowing me to participate in your care!   I recommend that you always bring your medications to each appointment as this makes it easy to ensure we are on the correct medications and helps [place] not miss when refills are needed.  Our plans for today:  - Continue stretches and prayer for neck stiffness - Please bring all of your medications to your next appointment   Take care and seek immediate care sooner if you develop any concerns. Please remember to show up 15 minutes before your scheduled appointment time!  Tiffany Kocher, DO [facility]

## 2023-07-02 NOTE — Assessment & Plan Note (Addendum)
Numbness, tingling on right side of neck and scalp.  Benign neuroexam. Primary suspect neuropathic pain secondary to neck injury and possible nerve involvement from prior cervical neck surgery.  Provided reassurance as symptoms have been stable for 1 year, this was unlikely to progress.  CT head/cervical unremarkable aside from prior healing cervical neck fractures.  Completed PT-improved functionality, not pain. Patient politely declines medication management at this time-opting for stretching and use of prayer. - Continue to stretch and use prayer - Follow-up if symptoms worsen
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
## 2023-07-13 ENCOUNTER — Encounter: Payer: Self-pay | Admitting: Student

## 2023-07-13 ENCOUNTER — Ambulatory Visit (INDEPENDENT_AMBULATORY_CARE_PROVIDER_SITE_OTHER): Payer: Medicaid Other | Admitting: Student

## 2023-07-13 VITALS — BP 135/85 | HR 88 | Ht 62.0 in | Wt 147.4 lb

## 2023-07-13 DIAGNOSIS — R7303 Prediabetes: Secondary | ICD-10-CM | POA: Diagnosis not present

## 2023-07-13 DIAGNOSIS — I10 Essential (primary) hypertension: Secondary | ICD-10-CM

## 2023-07-13 DIAGNOSIS — E785 Hyperlipidemia, unspecified: Secondary | ICD-10-CM | POA: Diagnosis not present

## 2023-07-13 LAB — POCT GLYCOSYLATED HEMOGLOBIN (HGB A1C): Hemoglobin A1C: 5.8 % — AB (ref 4.0–5.6)

## 2023-07-13 MED ORDER — HYDROCHLOROTHIAZIDE 25 MG PO TABS: 12.5000 mg | ORAL_TABLET | Freq: Every day | ORAL | 1 refills | Status: DC

## 2023-07-13 MED ORDER — AMLODIPINE BESYLATE 5 MG PO TABS
2.5000 mg | ORAL_TABLET | Freq: Every day | ORAL | 0 refills | Status: DC
Start: 2023-07-13 — End: 2023-11-09

## 2023-07-13 NOTE — Progress Notes (Signed)
    SUBJECTIVE:   CHIEF COMPLAINT / HPI:   HTN  medication reconciliation History of hypertension.  Patient reports she is currently taking 2.5 mg of amlodipine every other day (this is not her prescription) and 12.5 mg of hydrochlorothiazide daily.  Updated medication list.  She is only taking the medications above.  She has no systemic symptoms today.   OBJECTIVE:   BP 135/85   Pulse 88   Ht 5\' 2"  (1.575 m)   Wt 147 lb 6 oz (66.8 kg)   LMP 07/05/2020   SpO2 99%   BMI 26.96 kg/m    General: NAD, pleasant Cardio: RRR, no MRG. Respiratory: CTAB, normal wob on RA Skin: Warm and dry  ASSESSMENT/PLAN:   Essential hypertension Initial blood pressure 147/76, repeat 135/85.  Discussed amlodipine 5 mg daily, however patient is hesitant.  Using shared decision-making, we will continue 2.5 mg of Motrin every day and continue hydrochlorothiazide. - 2.5 mg amlodipine daily - 12.5 HCTZ daily - BMP - Follow-up 2 months  Health maintenance - Lipid screening today - A1c screening today - Updated medication list and problem list  Follow-up recommendations Discuss colonoscopy and Pap smear   Tiffany Kocher, DO [facility]

## 2023-07-13 NOTE — Assessment & Plan Note (Addendum)
Initial blood pressure 147/76, repeat 135/85.  Discussed amlodipine 5 mg daily, however patient is hesitant.  Using shared decision-making, we will continue 2.5 mg of Motrin every day and continue hydrochlorothiazide. - 2.5 mg amlodipine daily - 12.5 HCTZ daily - BMP - Follow-up 2 months

## 2023-07-13 NOTE — Patient Instructions (Signed)
It was great to see you! Thank you for allowing me to participate in your care!   I recommend that you always bring your medications to each appointment as this makes it easy to ensure we are on the correct medications and helps [place] not miss when refills are needed.  Our plans for today:  -Take 2.5 mg of amlodipine daily -Take 12.5 mg of hydrochlorothiazide daily -Follow-up in 2 months  We are checking some labs today, I will call you if they are abnormal will send you a MyChart message or a letter if they are normal.  If you do not hear about your labs in the next 2 weeks please let us know.  Take care and seek immediate care sooner if you develop any concerns. Please remember to show up 15 minutes before your scheduled appointment time!  Tiffany Kocher, DO [facility]

## 2023-07-15 ENCOUNTER — Telehealth: Payer: Self-pay | Admitting: Student

## 2023-07-15 NOTE — Telephone Encounter (Signed)
Encounter entered in error.

## 2023-08-06 IMAGING — CR DG CHEST 2V
2 series · 2 of 2 positions shown · non-contrast
Comparison: Prior chest radiographs 04/11/2007 and earlier.

CLINICAL DATA: Provided history: Cough. Additional history: Patient
reports feeling tired, cough, fever.

EXAM:
CHEST - 2 VIEW

[w chest pa]
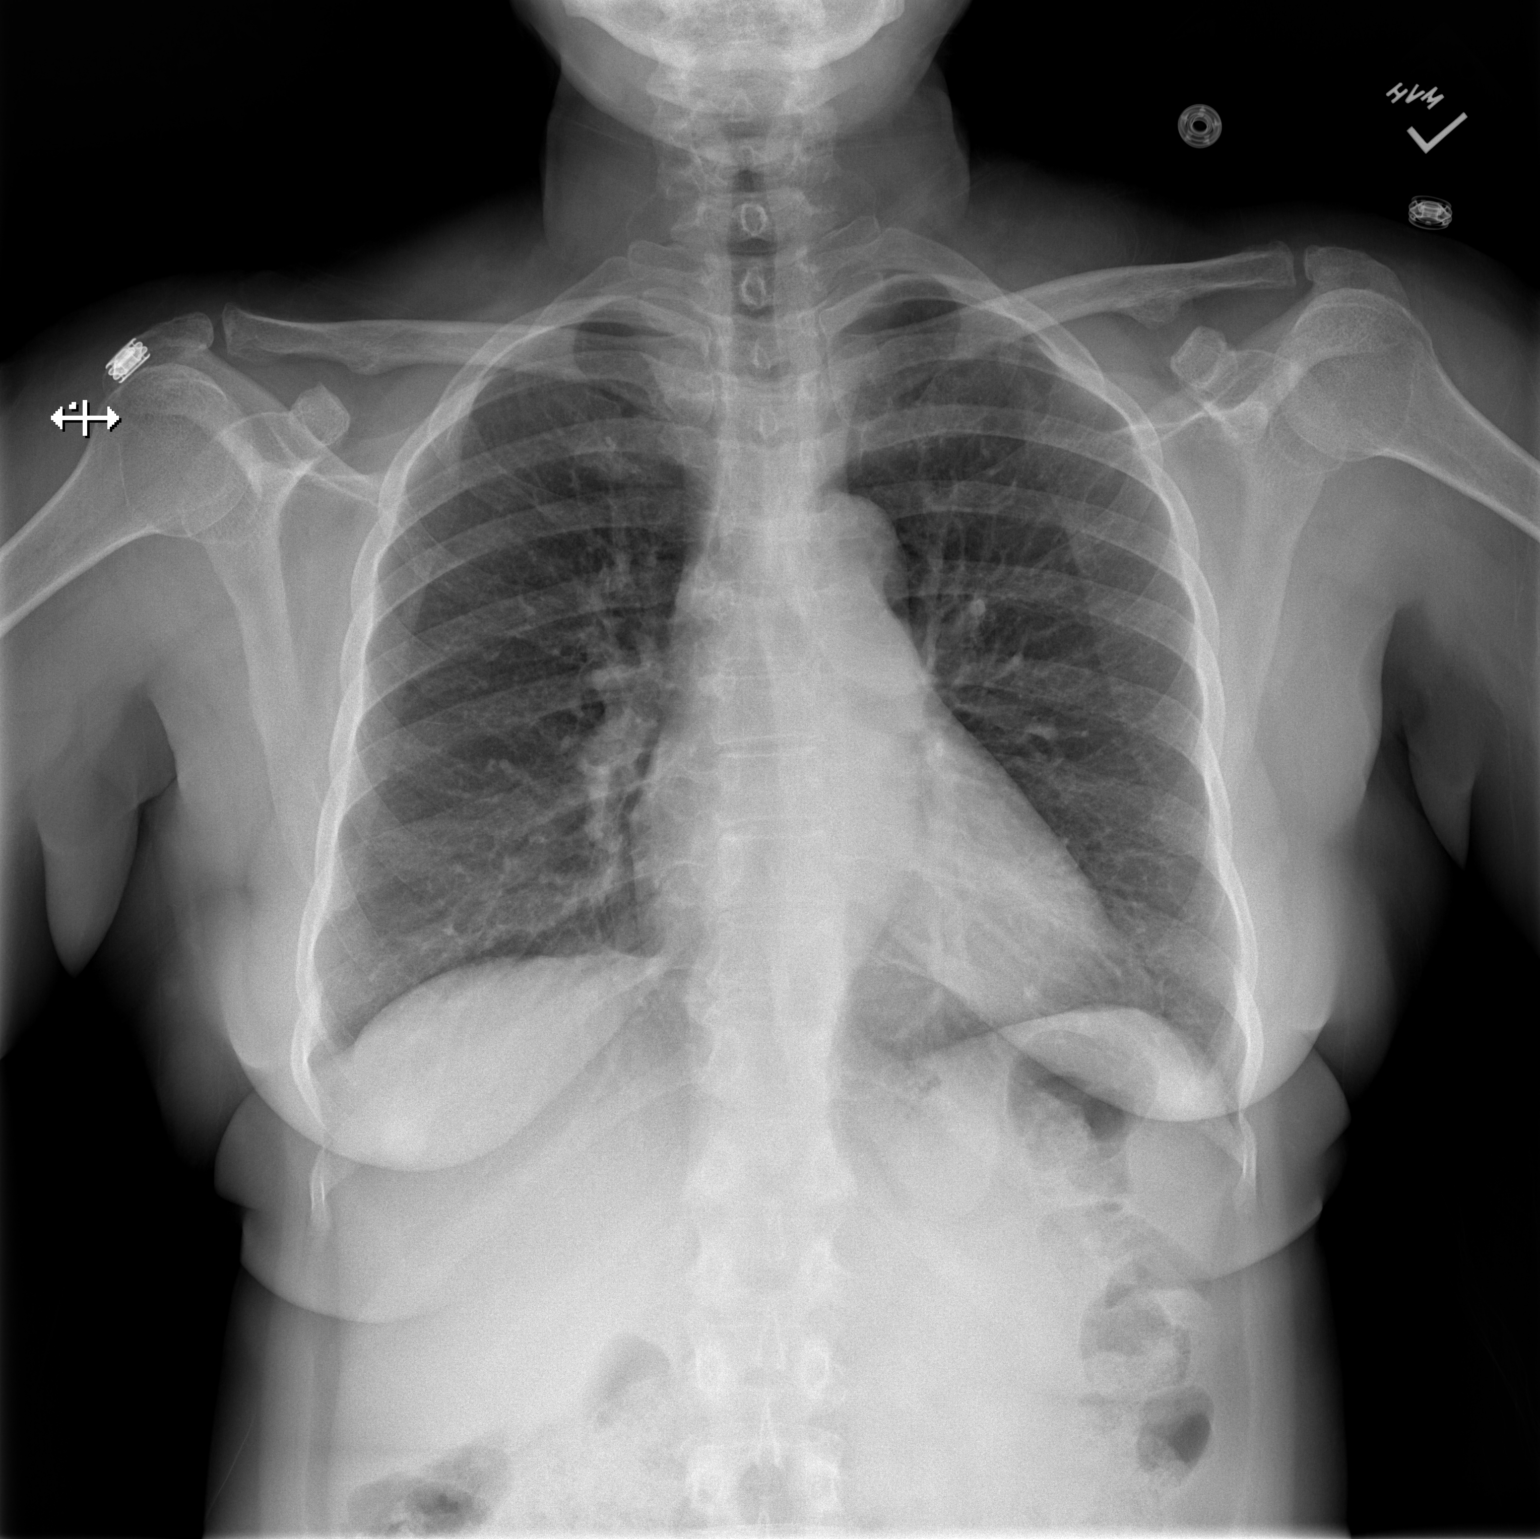

[w chest lat]
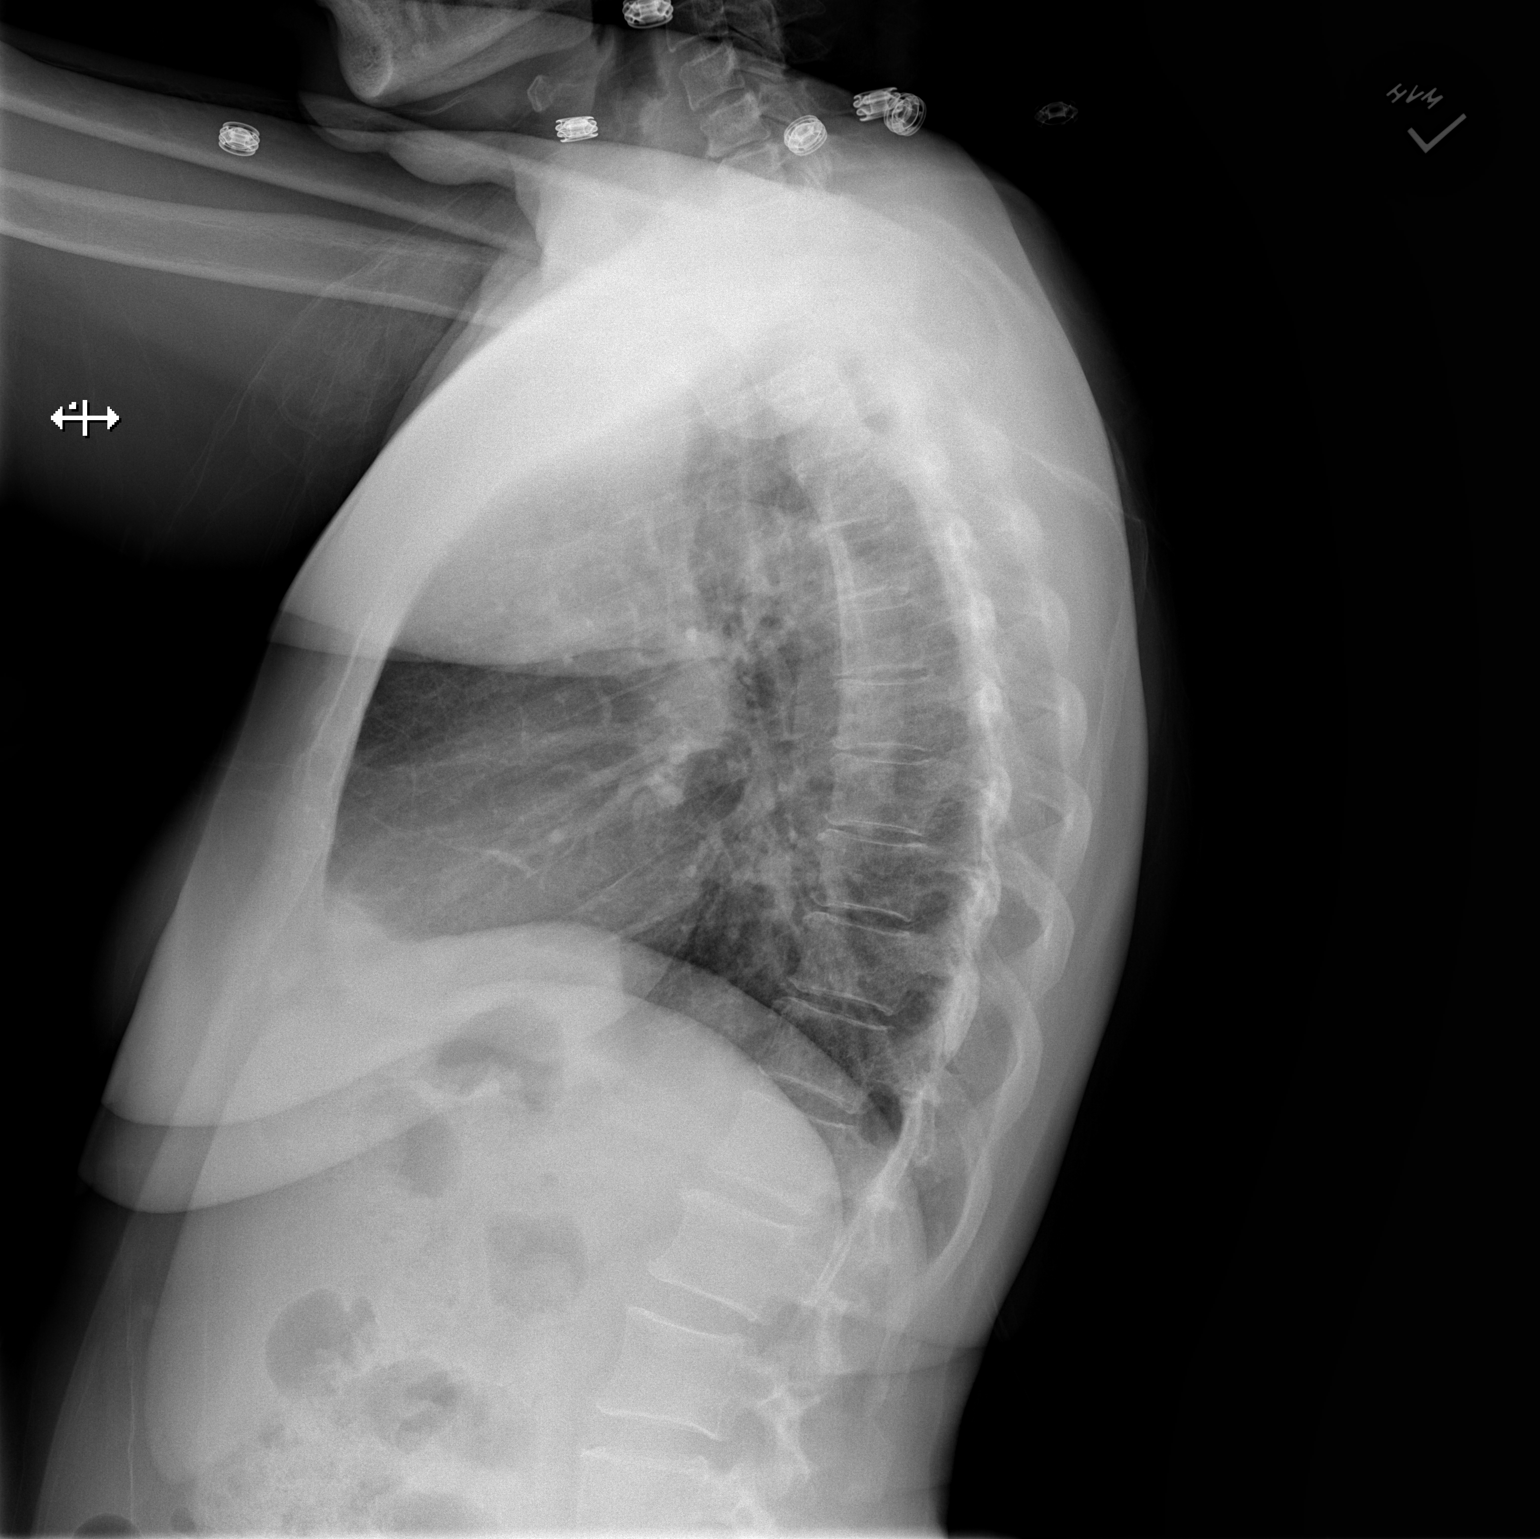

[2 of 2 positions shown; findings below may reference images not displayed]

FINDINGS: Heart size within normal limits. No appreciable air space
consolidation. No evidence of pleural effusion or pneumothorax. No
acute bony abnormality identified.
IMPRESSION: No evidence of acute cardiopulmonary abnormality.

## 2023-08-17 ENCOUNTER — Other Ambulatory Visit: Payer: Self-pay

## 2023-08-17 DIAGNOSIS — I10 Essential (primary) hypertension: Secondary | ICD-10-CM

## 2023-08-17 MED ORDER — HYDROCHLOROTHIAZIDE 25 MG PO TABS
12.5000 mg | ORAL_TABLET | Freq: Every day | ORAL | 1 refills | Status: DC
Start: 2023-08-17 — End: 2024-03-17

## 2023-09-09 ENCOUNTER — Encounter: Payer: Self-pay | Admitting: Student

## 2023-09-09 ENCOUNTER — Ambulatory Visit (INDEPENDENT_AMBULATORY_CARE_PROVIDER_SITE_OTHER): Payer: 59 | Admitting: Student

## 2023-09-09 VITALS — BP 121/75 | HR 82 | Ht 62.0 in | Wt 153.4 lb

## 2023-09-09 DIAGNOSIS — Z0000 Encounter for general adult medical examination without abnormal findings: Secondary | ICD-10-CM

## 2023-09-09 DIAGNOSIS — M180 Bilateral primary osteoarthritis of first carpometacarpal joints: Secondary | ICD-10-CM

## 2023-09-09 DIAGNOSIS — I10 Essential (primary) hypertension: Secondary | ICD-10-CM

## 2023-09-09 MED ORDER — DICLOFENAC SODIUM 1 % EX GEL
4.0000 g | Freq: Three times a day (TID) | CUTANEOUS | 1 refills | Status: DC
Start: 2023-09-09 — End: 2024-10-13

## 2023-09-09 NOTE — Assessment & Plan Note (Signed)
Exam and history consistent with first [facility] joint arthritis bilaterally.  Patient preference is to avoid oral medications, and injections. - Voltaren gel 3 times daily - Follow-up as necessary - If not improving, could consider x-ray imaging as well as injections versus oral anti-inflammatories

## 2023-09-09 NOTE — Assessment & Plan Note (Signed)
Well-controlled.  Discussed current regimen.  She decision-making to continue current regimen. - Continue amlodipine 2.5 mg and hydrochlorothiazide 12.5 mg

## 2023-09-09 NOTE — Patient Instructions (Signed)
It was great to see you! Thank you for allowing me to participate in your care!   I recommend that you always bring your medications to each appointment as this makes it easy to ensure we are on the correct medications and helps [place] not miss when refills are needed.  Our plans for today:  - Continue your current blood pressure medications - I have sent Voltaren Gel for your arthritis pain - I recommend a colonoscopy   I recommend you undergo a mammogram.   You can call to schedule an appointment by calling [phone].  Directions [address] [place], [place] [address]  Please let me know if you have questions. I will send you a letter or call you with results.    Take care and seek immediate care sooner if you develop any concerns. Please remember to show up 15 minutes before your scheduled appointment time!  Tiffany Kocher, DO [facility]

## 2023-09-09 NOTE — Progress Notes (Signed)
    SUBJECTIVE:   CHIEF COMPLAINT / HPI:   Hypertension Patient reports compliance with amlodipine and hydrochlorothiazide, without side effects.  Asymptomatic today.  CMC joint pain of thumb bilaterally Chronic pain for many months.  Patient frequently using her hands.  No swelling or changes in sensation.  Pain worse with gripping and motion of thumb.  Has not been taking any medication for this.  No prior imaging.   OBJECTIVE:   BP 121/75   Pulse 82   Ht 5\' 2"  (1.575 m)   Wt 153 lb 6 oz (69.6 kg)   LMP 07/05/2020   SpO2 100%   BMI 28.05 kg/m    General: NAD, pleasant  Cardio: RRR, no MRG. Cap Refill <2s. Respiratory: CTAB, normal wob on RA MSK: Bilateral wrist/hand: Squaring of thumb base bilaterally, no ecchymosis, no swelling. TTP over first CMC bilaterally.  Free range of motion bilaterally.  5/5 strength and normal sensation.  Finkelstein test negative.  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  Patient preference is to avoid oral medications, and injections. - Voltaren gel 3 times daily - Follow-up as necessary - If not improving, could consider x-ray imaging as well as injections versus oral anti-inflammatories Essential hypertension Well-controlled.  Discussed current regimen.  She decision-making to continue current regimen. - Continue amlodipine 2.5 mg and hydrochlorothiazide 12.5 mg Healthcare maintenance - Undecided on colonoscopy - Due for mammogram, number provided to schedule   Tiffany Kocher, DO [facility]

## 2023-10-30 IMAGING — US US BREAST*L* LIMITED INC AXILLA
1 series · 8 of 8 positions shown · non-contrast
Comparison: Previous exam(s).

CLINICAL DATA: 51-year-old female presenting for follow-up of
probably benign masses in the left breast.

EXAM:
ULTRASOUND OF THE LEFT BREAST

[Series 1: us breast*left* limited inc axilla · 0.06mm/px · 8 of 8 slices shown]
[im 1/8]
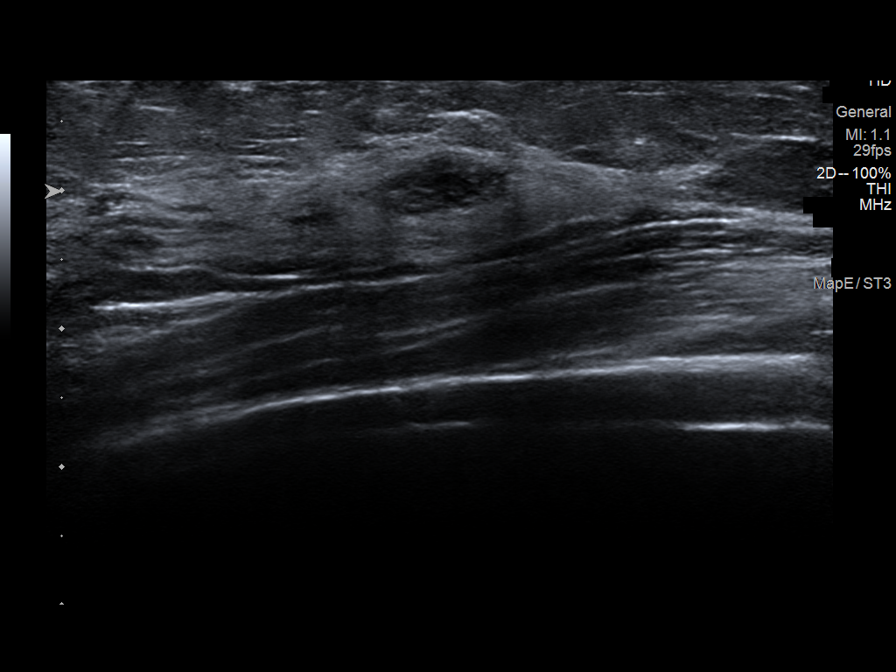
[im 2/8]
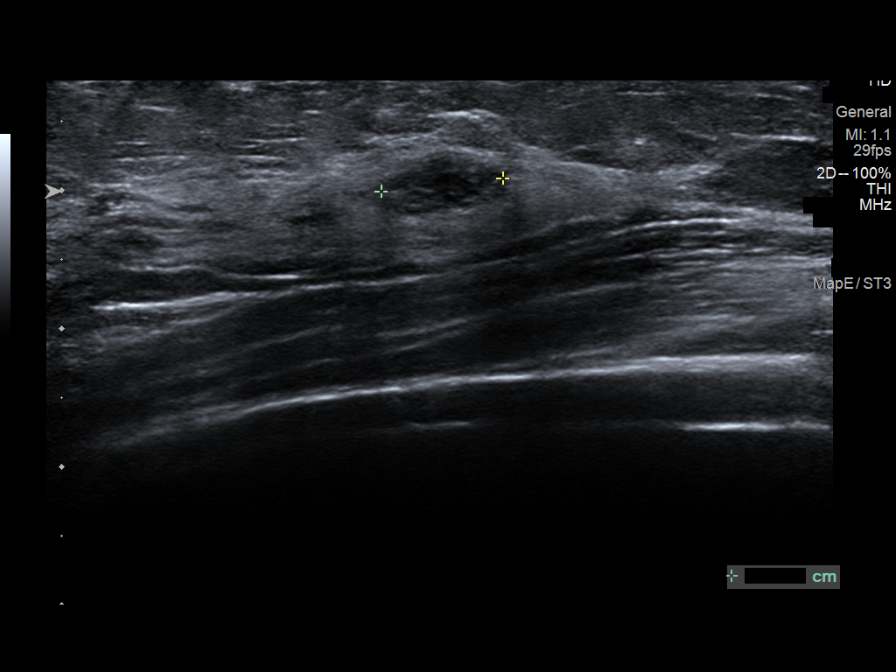
[im 3/8]
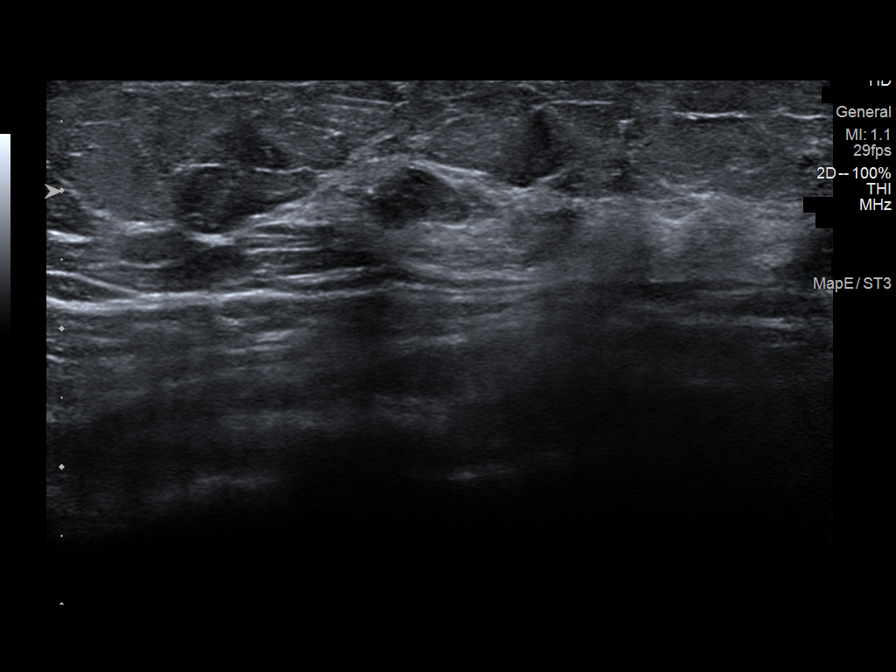
[im 4/8]
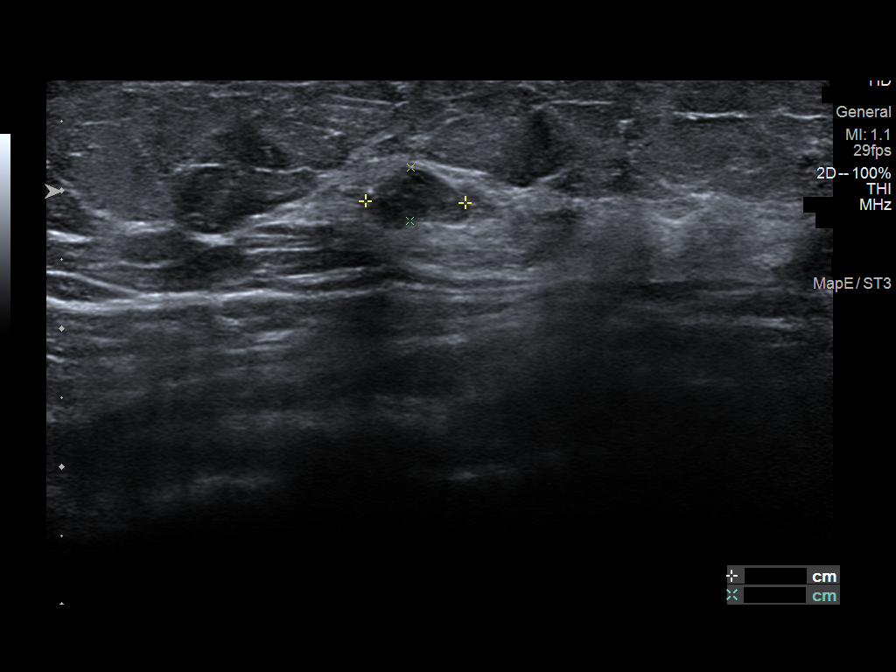
[im 5/8]
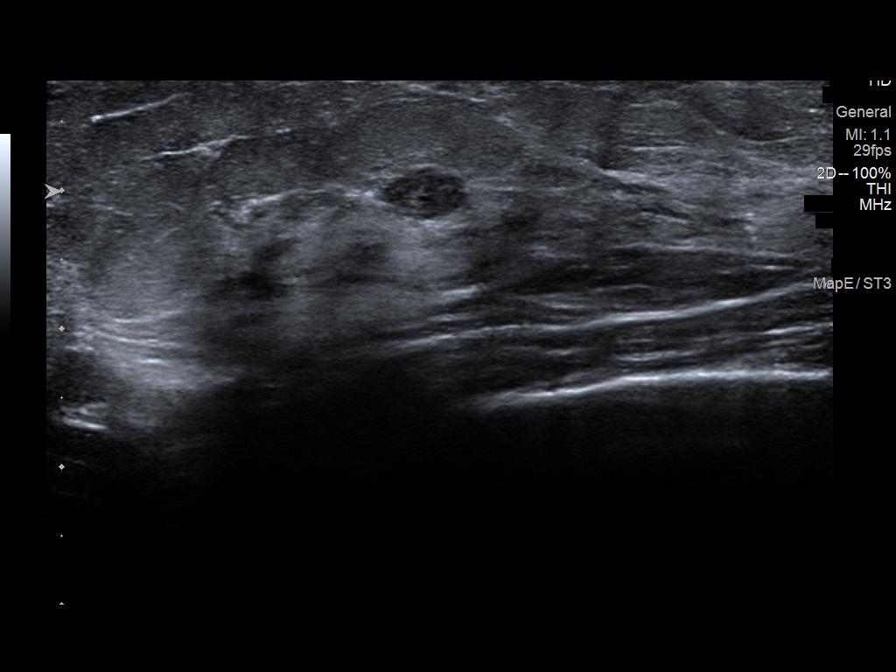
[im 6/8]
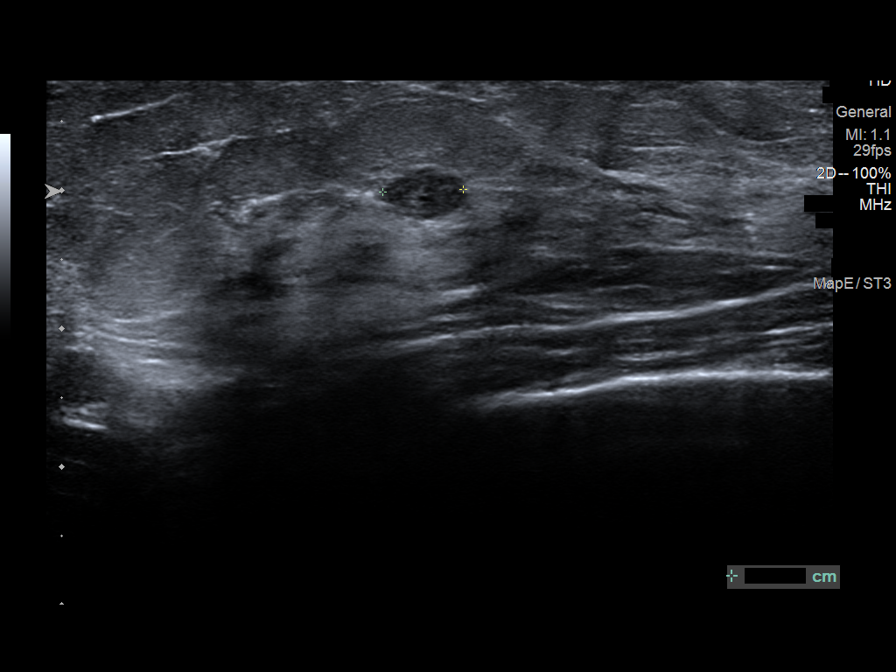
[im 7/8]
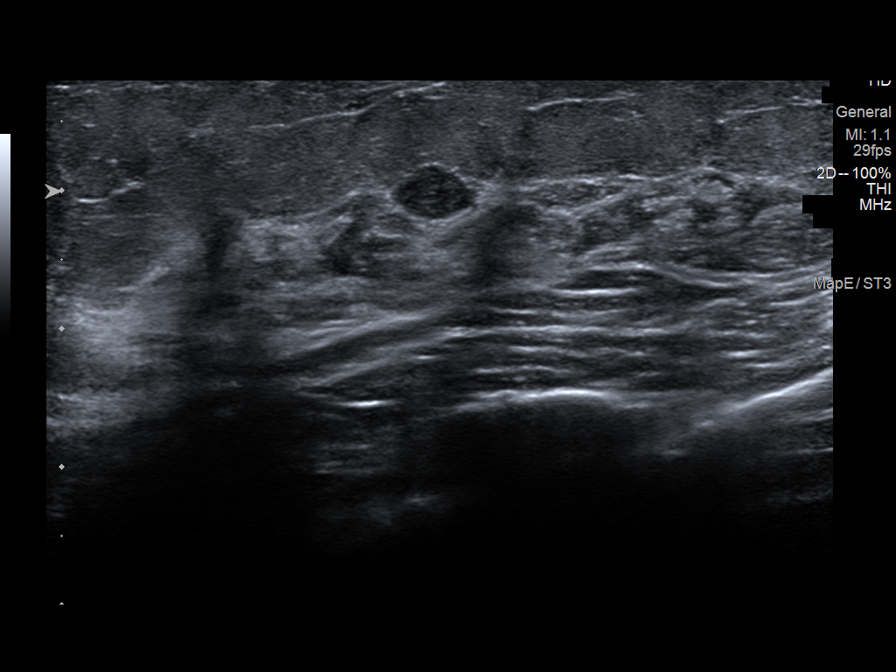
[im 8/8]
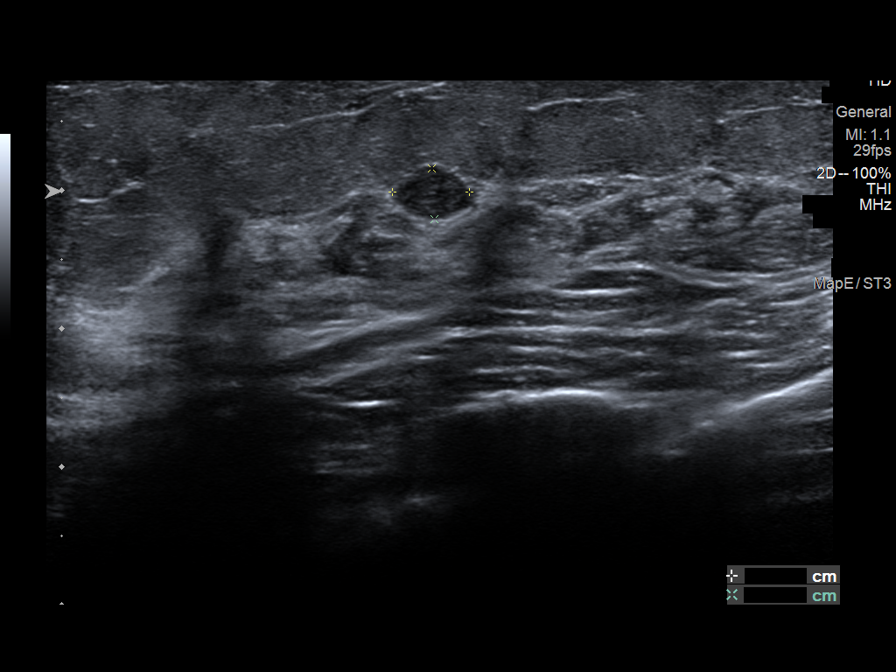

[8 of 8 positions shown; findings below may reference images not displayed]

FINDINGS: Targeted ultrasound is performed in the left breast at 2 o'clock 5
cm from the nipple demonstrating an oval circumscribed hypoechoic
mass measuring 0.9 x 0.4 x 0.7 cm, previously measuring 0.8 x 0.3 x
0.6 cm.

At 1 o'clock 3 cm from the nipple there is an oval circumscribed
hypoechoic mass measuring 0.6 x 0.4 x 0.6 cm, previously measuring
0.8 x 0.4 x 0.6 cm.
IMPRESSION: Stable probably benign masses in the left breast at 1 o'clock and 2
o'clock.

RECOMMENDATION:
Diagnostic bilateral mammogram and left breast ultrasound in 6
months.

I have discussed the findings and recommendations with the patient.
If applicable, a reminder letter will be sent to the patient
regarding the next appointment.

BI-RADS CATEGORY  3: Probably benign.

## 2023-11-09 ENCOUNTER — Telehealth: Payer: Self-pay | Admitting: Student

## 2023-11-09 DIAGNOSIS — I10 Essential (primary) hypertension: Secondary | ICD-10-CM

## 2023-11-09 MED ORDER — AMLODIPINE BESYLATE 5 MG PO TABS
2.5000 mg | ORAL_TABLET | Freq: Every day | ORAL | 1 refills | Status: DC
Start: 2023-11-09 — End: 2024-06-16

## 2023-11-09 NOTE — Telephone Encounter (Signed)
Medication refilled

## 2023-11-09 NOTE — Telephone Encounter (Signed)
Patient is completely out of Amlodipine 5 mg. She has not had it in 2 days and is needing it ASAP.   Please Advise.    Thanks!

## 2023-11-09 NOTE — Addendum Note (Signed)
Addended by: Tiffany Kocher on: 11/09/2023 01:05 PM   Modules accepted: Orders

## 2023-11-18 ENCOUNTER — Telehealth: Payer: Self-pay | Admitting: Family Medicine

## 2023-11-18 NOTE — Telephone Encounter (Signed)
-----   Message from [facility] sent at 11/18/2023  8:22 AM EST ----- Regarding: RE: PCP switch request I am good with switching.  Thanks! ----- Message ----- From: Westley Chandler, MD Sent: 11/18/2023   7:47 AM EST To: Doreene Eland, MD; Tiffany Kocher, DO Subject: RE: PCP switch request                         If okay with Dr. Claudean Severance I am good---I saw her husband yesterday :) ----- Message ----- From: Doreene Eland, MD Sent: 11/18/2023   7:11 AM EST To: Westley Chandler, MD; Tiffany Kocher, DO Subject: PCP switch request                             Hello Irven Coe Sharl Ma,  Patient reached out requesting PCP switch to Dr. Manson Passey since her husband also sees [facility].  Husband's name is Jeanice Lim. Are you both ok with the switch?  Dr. Lum Babe

## 2023-11-18 NOTE — Telephone Encounter (Signed)
PCP switch request approved.

## 2024-03-17 ENCOUNTER — Other Ambulatory Visit: Payer: Self-pay

## 2024-03-17 DIAGNOSIS — I10 Essential (primary) hypertension: Secondary | ICD-10-CM

## 2024-03-17 MED ORDER — HYDROCHLOROTHIAZIDE 25 MG PO TABS
12.5000 mg | ORAL_TABLET | Freq: Every day | ORAL | 3 refills | Status: DC
Start: 2024-03-17 — End: 2024-06-16

## 2024-03-20 ENCOUNTER — Encounter: Payer: Self-pay | Admitting: Student

## 2024-03-20 ENCOUNTER — Ambulatory Visit (INDEPENDENT_AMBULATORY_CARE_PROVIDER_SITE_OTHER): Admitting: Student

## 2024-03-20 VITALS — BP 164/81 | HR 80 | Ht 62.0 in | Wt 149.8 lb

## 2024-03-20 DIAGNOSIS — M180 Bilateral primary osteoarthritis of first carpometacarpal joints: Secondary | ICD-10-CM | POA: Diagnosis present

## 2024-03-20 NOTE — Assessment & Plan Note (Signed)
 Strongly suspect osteoarthritis.  Patient prefers to wait for lab work prior to sports medicine referral.  However endorses intermittent finger swelling, and history of rheumatoid in family. - ANA/CCP - Continue Voltaren gel - Consider sports medicine referral for [facility] joint injections pending lab work

## 2024-03-20 NOTE — Addendum Note (Signed)
 Addended by: Lavada Porteous on: 03/20/2024 04:38 PM   Modules accepted: Orders

## 2024-03-20 NOTE — Progress Notes (Signed)
    SUBJECTIVE:   CHIEF COMPLAINT / HPI:   Bilateral thumb pain Previously seen on 09/09/2023 for bilateral primary osteoarthritis of first metacarpal joints.  Patient prefers to avoid oral medications and injections.  She is currently been using Voltaren gel 3 times daily, without much improvement.  She is agreeable to injections, however is requesting blood work prior to injections.  She reports a family history of arthritis and she does have intermittent bilateral finger swelling.  However, no other deformities or joint pain.  No fevers, chills, night sweats, weight loss.  OBJECTIVE:   BP (!) 164/81   Pulse 80   Ht 5\' 2"  (1.575 m)   Wt 149 lb 12.8 oz (67.9 kg)   LMP 07/05/2020   SpO2 100%   BMI 27.40 kg/m    General: NAD, pleasant  Cardio: RRR, no MRG. Cap Refill <2s. Respiratory: CTAB, normal wob on RA GI: Abdomen is soft, not tender, not distended. BS present Bilateral hands: Bilateral squaring of first CMC joint.  Mild TTP over first [facility] joint.  F ROM.  5/5 strength and normal sensation.  ASSESSMENT/PLAN:   Assessment & Plan Arthritis of carpometacarpal (CMC) joints of both thumbs Strongly suspect osteoarthritis.  Patient prefers to wait for lab work prior to sports medicine referral.  However endorses intermittent finger swelling, and history of rheumatoid in family. - ANA/CCP - Continue Voltaren gel - Consider sports medicine referral for [facility] joint injections pending lab work   Follow-up recommendations Blood pressure still elevated on recheck.  Previously well-controlled with amlodipine/hydrochlorothiazide, pill. Recommend follow-up within 2 weeks for recheck.   Lavada Porteous, DO [facility]

## 2024-03-20 NOTE — Patient Instructions (Signed)
  Estuvo muy bien verte! Gracias por permitirme participar en su cuidado!   Nuestros planes para hoy: - Utilice crema de permetrina al 5%. - Por favor acuda a su cita con dermatologa.   Tenga cuidado y busque atencin inmediata lo antes posible si tiene alguna inquietud. Recuerde presentarse 15 minutos antes de la hora programada para su cita!  Martin Bronson, DO [facility]  

## 2024-03-21 LAB — ANA: Anti Nuclear Antibody (ANA): POSITIVE — AB

## 2024-03-22 LAB — CYCLIC CITRUL PEPTIDE ANTIBODY, IGG/IGA: Cyclic Citrullin Peptide Ab: 8 U (ref 0–19)

## 2024-03-27 ENCOUNTER — Other Ambulatory Visit: Payer: Self-pay | Admitting: Student

## 2024-03-27 DIAGNOSIS — M180 Bilateral primary osteoarthritis of first carpometacarpal joints: Secondary | ICD-10-CM

## 2024-03-27 NOTE — Progress Notes (Signed)
 ANA antibody placed.

## 2024-03-27 NOTE — Progress Notes (Signed)
 Order for add on ANA placed.   Clem Currier, DO [facility], PGY-2 03/27/24 8:40 AM

## 2024-03-29 ENCOUNTER — Ambulatory Visit: Admitting: Student

## 2024-03-29 NOTE — Progress Notes (Deleted)
    SUBJECTIVE:   CHIEF COMPLAINT / HPI:   ***  PERTINENT  PMH / PSH: ***  OBJECTIVE:   LMP 07/05/2020   ***  ASSESSMENT/PLAN:   Assessment & Plan      Lavada Porteous, DO [facility]

## 2024-03-30 ENCOUNTER — Ambulatory Visit

## 2024-03-30 VITALS — BP 138/74 | HR 80

## 2024-03-30 DIAGNOSIS — Z0130 Encounter for examination of blood pressure without abnormal findings: Secondary | ICD-10-CM

## 2024-03-30 NOTE — Progress Notes (Signed)
 Patient presents to [facility] as a walk in. She reports she missed her FU apt with [facility] yesterday. She requests her BP be checked.    Last BP was on 03/20/2024 and was 164/81.  BP today is 138/74 with a pulse of 80.    Checked BP in left arm with large cuff.    Symptoms present: None.   Patient last took BP meds Amlodipine  and Hydrochlorothiazide  this morning.   Patient has a FU apt scheduled with PCP for 04/20/2024.  Routed note to PCP.

## 2024-03-31 ENCOUNTER — Telehealth: Payer: Self-pay

## 2024-03-31 NOTE — Progress Notes (Signed)
 Please call patient and let her know blood pressure looks good--continue current medications   Thank you!  Otho Blitz, MD  Family Medicine Teaching Service

## 2024-03-31 NOTE — Telephone Encounter (Signed)
-----   Message from Azell Boll sent at 03/31/2024  8:55 AM EDT -----    ----- Message ----- From: Luciana Ruth, CMA Sent: 03/30/2024   4:44 PM EDT To: Azell Boll, MD

## 2024-03-31 NOTE — Telephone Encounter (Signed)
 Called patient, she did not answer. Will try again later. Alain Howard CMA

## 2024-04-03 ENCOUNTER — Telehealth: Payer: Self-pay

## 2024-04-03 LAB — ANTINUCLEAR ANTIBODIES, IFA: ANA Titer 1: NEGATIVE

## 2024-04-03 LAB — SPECIMEN STATUS REPORT

## 2024-04-03 NOTE — Telephone Encounter (Signed)
 Patient informed. Penni Bombard CMA

## 2024-04-03 NOTE — Telephone Encounter (Signed)
-----   Message from Azell Boll sent at 03/31/2024  8:55 AM EDT -----    ----- Message ----- From: Luciana Ruth, CMA Sent: 03/30/2024   4:44 PM EDT To: Azell Boll, MD

## 2024-04-11 ENCOUNTER — Encounter: Payer: Self-pay | Admitting: Student
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
## 2024-04-21 ENCOUNTER — Ambulatory Visit
Admission: RE | Admit: 2024-04-21 | Discharge: 2024-04-21 | Disposition: A | Discharge status: Home / Self Care | Payer: Self-pay | Source: Ambulatory Visit | Attending: Family Medicine | Admitting: Family Medicine

## 2024-04-21 ENCOUNTER — Ambulatory Visit: Payer: Self-pay | Admitting: Family Medicine

## 2024-04-21 ENCOUNTER — Encounter: Payer: Self-pay | Admitting: Family Medicine

## 2024-04-21 VITALS — BP 132/82 | HR 85 | Ht 62.0 in | Wt 148.2 lb

## 2024-04-21 DIAGNOSIS — M79646 Pain in unspecified finger(s): Secondary | ICD-10-CM

## 2024-04-21 DIAGNOSIS — I10 Essential (primary) hypertension: Secondary | ICD-10-CM

## 2024-04-21 DIAGNOSIS — J811 Chronic pulmonary edema: Secondary | ICD-10-CM | POA: Diagnosis not present

## 2024-04-21 DIAGNOSIS — Z1231 Encounter for screening mammogram for malignant neoplasm of breast: Secondary | ICD-10-CM | POA: Diagnosis not present

## 2024-04-21 DIAGNOSIS — Z1211 Encounter for screening for malignant neoplasm of colon: Secondary | ICD-10-CM | POA: Diagnosis not present

## 2024-04-21 NOTE — Patient Instructions (Addendum)
 It was wonderful to see you today.  Please bring ALL of your medications with you to every visit.   Today we talked about:   Please return in 1 month to check in and have blood work completed  For your hands  I think this may be a tendon, not arthritis Possibly DeQuervain's Tenosynovitis  An x-ray was ordered for you---you do not need an appointment to have this completed. This of your hands and chest. You had fluid on your lungs a few years ago.   I recommend going to [facility] [address] [place]  If the results are normal,I will send you a letter  I will call you with results if anything is abnormal    I have referred you to Sports medicine  to further evaluate your concern. If you have not received a phone call about this appointment within 3-4 weeks, please call our office back at [phone]. Rosanna Comment coordinates our referrals and can assist you in this.     I have referred you to Gastroenterology for colonoscopy If you have not received a phone call about this appointment within 3-4 weeks, please call our office back at [phone]. Rosanna Comment coordinates our referrals and can assist you in this.    I recommend you undergo a mammogram.   You can call to schedule an appointment by calling [phone].   Directions [address] [place], [place] [address]  Please let me know if you have questions. I will send you a letter or call you with results.     Thank you for choosing [facility].   Please call [phone] with any questions about today's appointment.  Please be sure to schedule follow up at the front  desk before you leave today.   Otho Blitz, MD  Family Medicine

## 2024-04-21 NOTE — Assessment & Plan Note (Signed)
 At goal  BMP at follow up

## 2024-05-02 ENCOUNTER — Encounter: Payer: Self-pay | Admitting: Family Medicine

## 2024-05-02 DIAGNOSIS — N63 Unspecified lump in unspecified breast: Secondary | ICD-10-CM

## 2024-06-12 ENCOUNTER — Encounter: Payer: Self-pay | Admitting: Family Medicine

## 2024-06-12 ENCOUNTER — Ambulatory Visit (INDEPENDENT_AMBULATORY_CARE_PROVIDER_SITE_OTHER): Admitting: Family Medicine

## 2024-06-12 VITALS — BP 126/64 | Ht 62.0 in | Wt 148.0 lb

## 2024-06-12 DIAGNOSIS — M79644 Pain in right finger(s): Secondary | ICD-10-CM | POA: Insufficient documentation

## 2024-06-12 DIAGNOSIS — M180 Bilateral primary osteoarthritis of first carpometacarpal joints: Secondary | ICD-10-CM

## 2024-06-12 NOTE — Assessment & Plan Note (Signed)
 bilateral CMC osteoarthritis of the thumb thumbs, right greater than left  Plan: - Visit notes with PCP from 04/21/2024 reviewed as noted above - X-rays reviewed with patient and personally interpreted by me today with findings as noted above - She has had limited improvement with conservative therapies including Voltaren  gel, essential oils, Tylenol .  Other treatment options discussed today including bracing, ultrasound-guided cortisone injection, hand therapy/OT.   -- She is not interested in ultrasound-guided injections or other injections due to concerns of underlying prediabetes and worsening of her blood sugars.   -- She is interested bracing, bilateral CMC thumb braces given to her today.  She should wear these at bedtime.  Can also wear during the day as needed --Discussed referral to hand therapy/OT.  She is not interested.  She would like to continue with home remedies including soaking in hot water, resting and hot sand, and potentially other home remedies that she learned from her time living in [place] - She can follow-up with us  if symptoms are worsening or if she is interested in injections, otherwise she can follow-up with us  on an as-needed basis

## 2024-06-12 NOTE — Progress Notes (Signed)
 DATE OF VISIT: 06/12/2024        Jessica Andrade DOB: 1971/02/11 MRN: [id number]  CC:  bilateral thumb pain  History of present Illness: Jessica Andrade is a 53 y.o. female who presents for bilateral thumb pain Referred by Dr. 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 Has been having chronic bilateral thumb pain at the base of her thumb for several years Has been acutely worsening Denies any injury or trauma Some associated swelling No improvement with Voltaren  gel, essential oils, Tylenol  Denies any associated numbness or tingling Has not been using any bracing Has not done hand therapy Has had imaging as noted below No prior injections, but has history of prediabetes and is not interested in injections  Medications:  Outpatient Encounter Medications as of 06/12/2024  Medication Sig   amLODipine  (NORVASC ) 5 MG tablet Take 0.5 tablets (2.5 mg total) by mouth daily.   diclofenac  Sodium (VOLTAREN  ARTHRITIS PAIN) 1 % GEL Apply 4 g topically 3 (three) times daily.   hydrochlorothiazide  (HYDRODIURIL ) 25 MG tablet Take 0.5 tablets (12.5 mg total) by mouth daily.   No facility-administered encounter medications on file as of 06/12/2024.    Allergies: is allergic to lisinopril.  Physical Examination: Vitals: BP 126/64 (BP Location: Left Arm, Patient Position: Sitting)   Ht 5' 2 (1.575 m)   Wt 148 lb (67.1 kg)   LMP 07/05/2020   BMI 27.07 kg/m  GENERAL:  Jessica Andrade is a 53 y.o. female appearing their stated age, alert and oriented x 3, in no apparent distress.  SKIN: no rashes or lesions, skin clean, dry, intact MSK: Hand/wrist: Bilateral hand and wrist with mild soft tissue swelling at the base of the thumb at the first [facility] joint bilaterally.  No increased redness or warmth.  Tender to palpation at the first [facility] joint on the right, mild tenderness palpation of the first [facility] joint on the left.  No other bony  tenderness throughout the hand or the wrist.  Normal grip strength, but does have pain at the [facility] joint bilaterally.  Normal opposition.  Normal intrinsic and extrinsic hand strength. NEURO: sensation intact to light touch upper extremity bilaterally VASC: pulses 2+ and symmetric radial artery bilaterally, no edema  Radiology: XRAY: Bilateral hand x-rays personally reviewed and interpreted by me today from 04/21/2024 showing: - Moderate to severe first CMC osteoarthritis on the right, associated DIP osteoarthritis at the second, third, fourth, fifth fingers - Moderate first CMC osteoarthritis on the left, associated DIP osteoarthritis at the second, third, fourth, fifth fingers - Images reviewed with patient during the visit today  Assessment & Plan Primary osteoarthritis of both first carpometacarpal joints bilateral CMC osteoarthritis of the thumb thumbs, right greater than left  Plan: - Visit notes with PCP from 04/21/2024 reviewed as noted above - X-rays reviewed with patient and personally interpreted by me today with findings as noted above - She has had limited improvement with conservative therapies including Voltaren  gel, essential oils, Tylenol .  Other treatment options discussed today including bracing, ultrasound-guided cortisone injection, hand therapy/OT.   -- She is not interested in ultrasound-guided injections or other injections due to concerns of underlying prediabetes and worsening of her blood sugars.   -- She is interested bracing, bilateral CMC thumb braces given to her today.  She should wear these at bedtime.  Can also wear during the day as needed --Discussed referral to hand therapy/OT.  She  is not interested.  She would like to continue with home remedies including soaking in hot water, resting and hot sand, and potentially other home remedies that she learned from her time living in [place] - She can follow-up with us  if symptoms are worsening or if she is interested in  injections, otherwise she can follow-up with us  on an as-needed basis   Patient expressed understanding & agreement with above.  Encounter Diagnosis  Name Primary?   Primary osteoarthritis of both first carpometacarpal joints Yes    No orders of the defined types were placed in this encounter.

## 2024-06-12 NOTE — Patient Instructions (Signed)
 Your thumb pain is due to arthritis at the base of your thumb - you have moderate to severe arthritis on the right - you have moderate arthritis on the left As discussed, you would benefit from heat therapy - you could consider Hand Therapy/Occupational Therapy - you can do hot soaks in hot water or hot sand if you have any at home We fitted you with thumb braces to wear at bedtime to help rest the joint - you can wear these during the day if needed You can continue other home remedies and/or Voltaren  gel as needed If symptoms are worsening and the pain is increasing we could consider a cortisone injection to the area Do not hesitate to reach out with any questions or concerns You can follow-up with me in 6-8 weeks if needed
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
## 2024-06-16 ENCOUNTER — Encounter: Payer: Self-pay | Admitting: Family Medicine

## 2024-06-16 ENCOUNTER — Ambulatory Visit (INDEPENDENT_AMBULATORY_CARE_PROVIDER_SITE_OTHER): Admitting: Family Medicine

## 2024-06-16 ENCOUNTER — Telehealth: Payer: Self-pay

## 2024-06-16 VITALS — BP 145/72 | HR 79 | Wt 150.8 lb

## 2024-06-16 DIAGNOSIS — I8390 Asymptomatic varicose veins of unspecified lower extremity: Secondary | ICD-10-CM

## 2024-06-16 DIAGNOSIS — E785 Hyperlipidemia, unspecified: Secondary | ICD-10-CM | POA: Diagnosis not present

## 2024-06-16 DIAGNOSIS — H538 Other visual disturbances: Secondary | ICD-10-CM

## 2024-06-16 DIAGNOSIS — Z1231 Encounter for screening mammogram for malignant neoplasm of breast: Secondary | ICD-10-CM

## 2024-06-16 DIAGNOSIS — Z1211 Encounter for screening for malignant neoplasm of colon: Secondary | ICD-10-CM | POA: Diagnosis not present

## 2024-06-16 DIAGNOSIS — I10 Essential (primary) hypertension: Secondary | ICD-10-CM | POA: Diagnosis not present

## 2024-06-16 DIAGNOSIS — Z1159 Encounter for screening for other viral diseases: Secondary | ICD-10-CM

## 2024-06-16 MED ORDER — HYDROCHLOROTHIAZIDE 25 MG PO TABS: 12.5000 mg | ORAL_TABLET | Freq: Every day | ORAL | 3 refills | Status: DC

## 2024-06-16 MED ORDER — AMLODIPINE BESYLATE 5 MG PO TABS: 5.0000 mg | ORAL_TABLET | Freq: Every day | ORAL | 3 refills | Status: DC

## 2024-06-16 NOTE — Telephone Encounter (Signed)
 MM orders faxed to [facility]. Fax number [phone].  Margit Dimes, CMA

## 2024-06-16 NOTE — Assessment & Plan Note (Signed)
Lipids today

## 2024-06-16 NOTE — Patient Instructions (Addendum)
 It was wonderful to see you today.  Please bring ALL of your medications with you to every visit.   Today we talked about:  Your blood pressure is a bit high We want your blood pressure around 130/80 Please take a whole tablet of amlodipine  5 mg every single day    I have referred you to an Eye Doctor to further evaluate your concern. If you have not received a phone call about this appointment within 3-4 weeks, please call our office back at [phone]. Margit Dimes coordinates our referrals and can assist you in this.   Mammogram Please call this number  Address: [address] [place], [place], [place] [address] Phone: [phone]   Please follow up in 3-4 weeks for blood pressure   Thank you for choosing [facility].   Please call [phone] with any questions about today's appointment.  Please be sure to schedule follow up at the front  desk before you leave today.   Suzann Daring, MD  Family Medicine

## 2024-06-16 NOTE — Assessment & Plan Note (Signed)
 Increase amlodipine  to 5 mg daily Continue other medications BMP today Follow up 3-4 weeks

## 2024-06-17 LAB — HEPATITIS B SURFACE ANTIGEN: Hepatitis B Surface Ag: NEGATIVE

## 2024-06-17 LAB — BASIC METABOLIC PANEL WITH GFR
BUN/Creatinine Ratio: 17 (ref 9–23)
BUN: 13 mg/dL (ref 6–24)
CO2: 22 mmol/L (ref 20–29)
Calcium: 9.6 mg/dL (ref 8.7–10.2)
Chloride: 98 mmol/L (ref 96–106)
Creatinine, Ser: 0.78 mg/dL (ref 0.57–1.00)
Glucose: 169 mg/dL — ABNORMAL HIGH (ref 70–99)
Potassium: 3.7 mmol/L (ref 3.5–5.2)
Sodium: 139 mmol/L (ref 134–144)
eGFR: 91 mL/min/1.73 (ref >59)

## 2024-06-17 LAB — LIPID PANEL
Chol/HDL Ratio: 4.5 ratio — ABNORMAL HIGH (ref 0.0–4.4)
Cholesterol, Total: 221 mg/dL — ABNORMAL HIGH (ref 100–199)
HDL: 49 mg/dL (ref >39)
LDL Chol Calc (NIH): 142 mg/dL — ABNORMAL HIGH (ref 0–99)
Triglycerides: 167 mg/dL — ABNORMAL HIGH (ref 0–149)
VLDL Cholesterol Cal: 30 mg/dL (ref 5–40)

## 2024-06-17 LAB — HEPATITIS B SURFACE ANTIBODY, QUANTITATIVE: Hepatitis B Surf Ab Quant: 36.5 m[IU]/mL

## 2024-06-17 LAB — HEPATITIS B CORE ANTIBODY, TOTAL: Hep B Core Total Ab: POSITIVE — AB

## 2024-06-18 ENCOUNTER — Ambulatory Visit: Payer: Self-pay | Admitting: Family Medicine

## 2024-07-10 ENCOUNTER — Ambulatory Visit: Admitting: Family Medicine

## 2024-07-13 ENCOUNTER — Ambulatory Visit: Admitting: Family Medicine

## 2024-07-20 NOTE — Progress Notes (Signed)
    SUBJECTIVE:   CHIEF COMPLAINT: check up  HPI:   Jessica Andrade is a 53 y.o.  with history notable for HTN presenting for follow up.    Patient reports overall she is doing okay.  Her primary complaint is burning on the soles of her feet.  She reports this been ongoing for several months.  She is concerned she has gout.  She denies back pain.  She denies morning pain.  The pain is worse at the end of the day after she is worn tight shoes.  She is try to change her shoes without relief.  She takes vitamin B12 already.  She denies polyuria polydipsia or restrictive eating patterns.  Patient reports taking her blood pressure pill as prescribed.    PERTINENT  PMH / PSH/Family/Social History : Primary hypertension, hand osteoarthritis  OBJECTIVE:   BP 130/72   Pulse 79   Ht 5' 2 (1.575 m)   Wt 152 lb (68.9 kg)   LMP 07/05/2020   SpO2 98%   BMI 27.80 kg/m   Today's weight:  Last Weight  Most recent update: 07/21/2024 10:33 AM    Weight  68.9 kg (152 lb)            Review of prior weights: [employer]   07/21/24 1033  Weight: 152 lb (68.9 kg)   RRR Lungs clear Mild pez planus No deformity 2+ pulses No ulcers  Sensate on monofilament   ASSESSMENT/PLAN:   Assessment & Plan Paresthesia Only on soles of feet ? B12 deficiency or type 2 DM Will check each of these today  Considered sciatica although this is not associated back pain or radiating pain, plantar fasciitis and small fiber neuropathy although I think this is all less likely. Essential hypertension At goal continue current medications.    Suzann Daring, MD  Family Medicine Teaching Service  [facility]

## 2024-07-21 ENCOUNTER — Encounter: Payer: Self-pay | Admitting: Family Medicine

## 2024-07-21 ENCOUNTER — Ambulatory Visit (INDEPENDENT_AMBULATORY_CARE_PROVIDER_SITE_OTHER): Admitting: Family Medicine

## 2024-07-21 VITALS — BP 130/72 | HR 79 | Ht 62.0 in | Wt 152.0 lb

## 2024-07-21 DIAGNOSIS — R202 Paresthesia of skin: Secondary | ICD-10-CM | POA: Diagnosis not present

## 2024-07-21 DIAGNOSIS — I10 Essential (primary) hypertension: Secondary | ICD-10-CM

## 2024-07-21 NOTE — Patient Instructions (Signed)
 It was wonderful to see you today.  Please bring ALL of your medications with you to every visit.   Today we talked about:   Please call this number for your mammogram Address: [address] [place], [place], [place] [address] Phone: [phone]  Our referrals coordinator will send your eye doctor referral  Please go across the street to [address] [facility] for labs  I will call you with results   Please follow up in 3 months   Thank you for choosing [facility].   Please call [phone] with any questions about today's appointment.  Please be sure to schedule follow up at the front  desk before you leave today.   Suzann Daring, MD  Family Medicine

## 2024-07-21 NOTE — Assessment & Plan Note (Signed)
 At goal continue current medications

## 2024-07-22 LAB — HEMOGLOBIN A1C
Est. average glucose Bld gHb Est-mCnc: 126 mg/dL
Hgb A1c MFr Bld: 6 % — ABNORMAL HIGH (ref 4.8–5.6)

## 2024-07-22 LAB — VITAMIN B12: Vitamin B-12: 388 pg/mL (ref 232–1245)

## 2024-07-24 ENCOUNTER — Ambulatory Visit: Payer: Self-pay | Admitting: Family Medicine

## 2024-07-24 DIAGNOSIS — I10 Essential (primary) hypertension: Secondary | ICD-10-CM

## 2024-07-24 MED ORDER — GABAPENTIN 100 MG PO CAPS: 100.0000 mg | ORAL_CAPSULE | Freq: Three times a day (TID) | ORAL | 1 refills | Status: AC

## 2024-07-24 MED ORDER — AMLODIPINE BESYLATE 5 MG PO TABS: 5.0000 mg | ORAL_TABLET | Freq: Every day | ORAL | 3 refills | Status: DC

## 2024-07-24 NOTE — Telephone Encounter (Signed)
 Called with results, discussed results.  Discussed prediabetes.  Discussed treatment options. Rx gabapentin  at night.   Consider ferritin at follow up.

## 2024-07-25 ENCOUNTER — Telehealth: Payer: Self-pay

## 2024-07-25 NOTE — Telephone Encounter (Signed)
 Patient calls nurse line regarding hydrochlorothiazide . She reports that she went to pick up other prescriptions yesterday, however, was told they did not have hydrochlorothiazide  prescription.   Called pharmacy. Patient picked up 90 day supply on 06/11/24, therefore, is not due for another refill.   Called patient and advised. She believes she may have lost medication. Advised patient that she could contact insurance company for override for lost medication (we are unable to do this on behalf of patient).   Patient reports that she will contact insurance company and then pharmacy.   Chiquita JAYSON English, RN

## 2024-07-31 ENCOUNTER — Ambulatory Visit: Admitting: Family Medicine

## 2024-10-09 ENCOUNTER — Ambulatory Visit: Admitting: Family Medicine

## 2024-10-12 NOTE — Progress Notes (Unsigned)
    SUBJECTIVE:   CHIEF COMPLAINT: HTN, hand pain HPI:   Jessica Andrade is a 53 y.o.  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  She reports her hand pain continues to bother her.  Voltaren  gel does not help.  She was previous evaluated by sports medicine and diagnosed with CMC arthritis.  She reports that she really has this sharp stabbing pain that she thinks is a nerve nerve over her right dorsal surface and thenar eminence.  This pain can come anytime when she is doing her hair or adjusting her scarf or even with light touch.  She does not have pain at night.  She does not soak her hands in the middle of the night to wake up.  She has no other numbness or weakness.  Voltaren  gel has been ineffective.  The patient reports she did not take her amlodipine  or HCTZ today.  She forgot.  She denies headaches chest pain or blurry vision.    PERTINENT  PMH / PSH/Family/Social History : Hypertension  OBJECTIVE:   BP 138/77   Pulse 77   Wt 156 lb 12.8 oz (71.1 kg)   LMP 07/05/2020   SpO2 99%   BMI 28.68 kg/m   Today's weight:  Last Weight  Most recent update: 10/13/2024  8:20 AM    Weight  71.1 kg (156 lb 12.8 oz)            Review of prior weights: [employer]   10/13/24 0819  Weight: 156 lb 12.8 oz (71.1 kg)    RRR Lungs clear Bilateral grip strength symmetric No deformity of hand No rash Unable to elicit pain with loading joint today  Negative finkelsteins  Prior xrays  IMPRESSION: 1. 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 2. 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  ASSESSMENT/PLAN:   Assessment & Plan Essential hypertension Not at goal initially and she reports home values above BP goal Continue amlodipine  5 mg and hydrochlorothiazide  12.5 mg for now Discussed adherence Scheduled for nurse visit for BP check, may need combination (olmesartan amlodipine )  Encounter for screening  mammogram for malignant neoplasm of breast Ordered and discussed sensitivity, false positives, number given Right hand pain DDX includes nerve entrapment, though I still most suspect OA Referral to hand surgery   Declined flu, covid, PCV20 (discussed) and colonoscopy (will continue to discuss)    A1C at next visit    Suzann Daring, MD  Family Medicine Teaching Service  [facility]

## 2024-10-13 ENCOUNTER — Encounter: Payer: Self-pay | Admitting: Family Medicine

## 2024-10-13 ENCOUNTER — Ambulatory Visit (INDEPENDENT_AMBULATORY_CARE_PROVIDER_SITE_OTHER): Admitting: Family Medicine

## 2024-10-13 VITALS — BP 138/77 | HR 77 | Wt 156.8 lb

## 2024-10-13 DIAGNOSIS — Z1231 Encounter for screening mammogram for malignant neoplasm of breast: Secondary | ICD-10-CM

## 2024-10-13 DIAGNOSIS — M79641 Pain in right hand: Secondary | ICD-10-CM | POA: Diagnosis not present

## 2024-10-13 DIAGNOSIS — I10 Essential (primary) hypertension: Secondary | ICD-10-CM | POA: Diagnosis present

## 2024-10-13 NOTE — Patient Instructions (Addendum)
 It was wonderful to see you today.  Please bring ALL of your medications with you to every visit.   Today we talked about:  For your hand, you will  be called by a hand specialist   Below are two numbers to call   [facility] [address] [place], [place] [address] [phone]  I recommend you undergo a mammogram.   You can call to schedule an appointment by calling [phone].   Directions [address] [place], [place] [address]  Please let me know if you have questions. I will send you a letter or call you with results.    Please follow up in 3 months   Thank you for choosing [facility].   Please call [phone] with any questions about today's appointment.  Please be sure to schedule follow up at the front  desk before you leave today.   Suzann Daring, MD  Family Medicine

## 2024-10-13 NOTE — Assessment & Plan Note (Signed)
 Not at goal initially and she reports home values above BP goal Continue amlodipine  5 mg and hydrochlorothiazide  12.5 mg for now Discussed adherence Scheduled for nurse visit for BP check, may need combination (olmesartan amlodipine )

## 2024-10-14 ENCOUNTER — Encounter (HOSPITAL_BASED_OUTPATIENT_CLINIC_OR_DEPARTMENT_OTHER): Payer: Self-pay

## 2024-10-14 ENCOUNTER — Emergency Department (HOSPITAL_BASED_OUTPATIENT_CLINIC_OR_DEPARTMENT_OTHER)
Admission: EM | Admit: 2024-10-14 | Discharge: 2024-10-14 | Disposition: A | Discharge status: Home / Self Care | Attending: Emergency Medicine | Admitting: Emergency Medicine

## 2024-10-14 ENCOUNTER — Other Ambulatory Visit: Payer: Self-pay

## 2024-10-14 DIAGNOSIS — Z79899 Other long term (current) drug therapy: Secondary | ICD-10-CM | POA: Diagnosis not present

## 2024-10-14 DIAGNOSIS — I10 Essential (primary) hypertension: Secondary | ICD-10-CM | POA: Insufficient documentation

## 2024-10-14 LAB — BASIC METABOLIC PANEL WITH GFR
Anion gap: 10 (ref 5–15)
BUN: 9 mg/dL (ref 6–20)
CO2: 29 mmol/L (ref 22–32)
Calcium: 9.8 mg/dL (ref 8.9–10.3)
Chloride: 100 mmol/L (ref 98–111)
Creatinine, Ser: 0.65 mg/dL (ref 0.44–1.00)
GFR, Estimated: >60 mL/min (ref >60)
Glucose, Bld: 176 mg/dL — ABNORMAL HIGH (ref 70–99)
Potassium: 3.6 mmol/L (ref 3.5–5.1)
Sodium: 139 mmol/L (ref 135–145)

## 2024-10-14 LAB — CBC
HCT: 39.3 % (ref 36.0–46.0)
Hemoglobin: 13.1 g/dL (ref 12.0–15.0)
MCH: 27.8 pg (ref 26.0–34.0)
MCHC: 33.3 g/dL (ref 30.0–36.0)
MCV: 83.3 fL (ref 80.0–100.0)
Platelets: 189 K/uL (ref 150–400)
RBC: 4.72 MIL/uL (ref 3.87–5.11)
RDW: 12.8 % (ref 11.5–15.5)
WBC: 4.4 K/uL (ref 4.0–10.5)
nRBC: 0 % (ref 0.0–0.2)

## 2024-10-14 NOTE — Discharge Instructions (Signed)
 It was a pleasure caring for you today in the emergency department.  Please take your blood pressure medication as prescribed.  Please follow a low-salt/Dash diet as outlined on handout.  Please follow-up with your PCP for recheck of your blood pressure and further medication management  Please return to the emergency department for any worsening or worrisome symptoms.

## 2024-10-14 NOTE — ED Provider Notes (Signed)
 [place] EMERGENCY DEPARTMENT AT [facility] Provider Note  CSN: [id number] Arrival date & time: 10/14/24 0844  Chief Complaint(s) Hypertension  HPI Jessica Andrade is a 53 y.o. female with past medical history as below, significant for LD, hypertension, osteoarthritis who presents to the ED with complaint of elevated blood pressure reading  Reports elevated blood pressure reading at home, she is prescribed amlodipine  and hydrochlorothiazide .  Blood pressure 197/107, no associated chest pain, headache, lightheadedness, nausea or vomiting.  No palpitations or dizziness.  Reports compliance with her blood pressure medication  Reports that she ate some of her husbands dinner last night which was very salty.  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 Authorizing Provider  amLODipine  (NORVASC ) 5 MG tablet Take 1 tablet (5 mg total) by mouth daily. 07/24/24  Yes Delores Suzann CHRISTELLA, MD  gabapentin  (NEURONTIN ) 100 MG capsule Take 1 capsule (100 mg total) by mouth 3 (three) times daily. 07/24/24  Yes Delores Suzann CHRISTELLA, MD  hydrochlorothiazide  (HYDRODIURIL ) 25 MG tablet Take 0.5 tablets (12.5 mg total) by mouth daily. 06/16/24  Yes Delores Suzann CHRISTELLA, MD                                                                                                                                     Past Surgical History Past Surgical History:  Procedure Laterality Date   ANTERIOR CERVICAL DECOMP/DISCECTOMY FUSION N/A 08/20/2022   Procedure: C TWO -THREE ANTERIOR CERVICAL DECOMPRESSION/DISCECTOMY FUSION 1 LEVEL;  Surgeon: Cheryle Debby LABOR, MD;  Location: [facility] OR;  Service: Neurosurgery;  Laterality: N/A;   CESAREAN SECTION     CESAREAN SECTION     Family History Family History  Problem Relation Age of Onset   Healthy Mother    Hypertension Father    Heart attack Father 83    Social History Social History   Tobacco Use   Smoking status: Never    Passive exposure: Never   Smokeless tobacco: Never  Vaping Use   Vaping status: Never Used  Substance Use Topics   Alcohol use: Not Currently   Drug use: Not Currently   Allergies Lisinopril  Review of Systems A thorough review of systems was obtained and all systems are negative except as noted in the HPI and PMH.  Physical Exam Vital Signs  I have reviewed the triage vital signs BP (!) 144/75   Pulse 73   Temp 98 F (36.7 C)   Resp 15   LMP 07/05/2020   SpO2 97%  Physical Exam Vitals and nursing note reviewed.  Constitutional:      General: She is not in acute distress.    Appearance: Normal appearance. She is well-developed. She is not ill-appearing.  HENT:     Head: Normocephalic and atraumatic.     Right Ear: External ear normal.     Left Ear: External ear normal.     Nose: Nose normal.     Mouth/Throat:     Mouth: Mucous membranes are moist.  Eyes:     General: No scleral icterus.       Right eye: No discharge.        Left eye: No discharge.  Cardiovascular:     Rate and Rhythm: Normal rate and regular rhythm.     Pulses: Normal pulses.     Heart sounds: Normal heart sounds. No murmur heard. Pulmonary:     Effort: Pulmonary effort is normal. No respiratory distress.     Breath sounds: No stridor.  Abdominal:     General: Abdomen is flat. There is no  distension.     Tenderness: There is no guarding.  Musculoskeletal:        General: No deformity.     Cervical back: No rigidity.     Right lower leg: No edema.     Left lower leg: No edema.  Skin:    General: Skin is warm and dry.     Coloration: Skin is not cyanotic, jaundiced or pale.  Neurological:     Mental Status: She is alert.  Psychiatric:        Speech: Speech normal.        Behavior: Behavior normal. Behavior is cooperative.     ED Results and Treatments Labs (all labs ordered are listed, but only abnormal results are displayed) Labs Reviewed  BASIC METABOLIC PANEL WITH GFR - Abnormal; Notable for the following components:      Result Value   Glucose, Bld 176 (*)    All other components within normal limits  CBC                                                                                                                          Radiology No results found.  Pertinent labs & imaging results that were available during my care of the patient were reviewed by me and considered in my medical decision making (see MDM for details).  Medications Ordered in ED Medications - No data to display  Procedures Procedures  (including critical care time)  Medical Decision Making / ED Course    Medical Decision Making:    Jessica Andrade is a 53 y.o. female with past medical history as below, significant for LD, hypertension, osteoarthritis who presents to the ED with complaint of elevated blood pressure reading. The complaint involves an extensive differential diagnosis and also carries with it a high risk of complications and morbidity.  Serious etiology was considered. Ddx includes but is not limited to: Poorly controlled hypertension, asymptomatic hypertension, hypertensive emergency, renal dysfunction, etc.  Complete initial physical exam  performed, notably the patient was in no acute distress.    Reviewed and confirmed nursing documentation for past medical history, family history, social history.  Vital signs reviewed.    Asymptomatic hypertension> - History of hypertension, she is prescribed amlodipine  and HCTZ.  Recent high salt meal.  Currently symptomatic.  Blood pressure elevated this morning has since improved after taking blood pressure medication - Check renal function > stable - She does not appear to have evidence of endorgan damage or hypertensive emergency.  She is asymptomatic. - Encourage low-salt diet, encouraged ongoing compliance with her blood pressure medication regimen, encourage follow-up with PCP for further HTN mgmt  10:15 AM:  I have discussed the diagnosis/risks/treatment options with the patient and family.  Evaluation and diagnostic testing in the emergency department does not suggest an emergent condition requiring admission or immediate intervention beyond what has been performed at this time.  They will follow up with PCP. We also discussed returning to the ED immediately if new or worsening sx occur. We discussed the sx which are most concerning (e.g., sudden worsening pain, fever, inability to tolerate by mouth) that necessitate immediate return.    The patient appears reasonably screened and/or stabilized for discharge and I doubt any other medical condition or other [facility] requiring further screening, evaluation, or treatment in the ED at this time prior to discharge.                        Additional history obtained: -Additional history obtained from family -External records from outside source obtained and reviewed including: Chart review including previous notes, labs, imaging, consultation notes including  Home medications, prior labs   Lab Tests: -I ordered, reviewed, and interpreted labs.   The pertinent results include:   Labs Reviewed  BASIC METABOLIC PANEL WITH  GFR - Abnormal; Notable for the following components:      Result Value   Glucose, Bld 176 (*)    All other components within normal limits  CBC    Notable for labs are stable, elevated glucose; no evidence of DKA  EKG   EKG Interpretation Date/Time:    Ventricular Rate:    PR Interval:    QRS Duration:    QT Interval:    QTC Calculation:   R Axis:      Text Interpretation:           Imaging Studies ordered: Not applicable  Medicines ordered and prescription drug management: No orders of the defined types were placed in this encounter.   -I have reviewed the patients home medicines and have made adjustments as needed   Consultations Obtained: Not applicable  Cardiac Monitoring: Continuous pulse oximetry interpreted by myself, 98% on RA.    Social Determinants of Health:  Diagnosis or treatment significantly limited by social determinants of health: na   Reevaluation: After the interventions noted above, I reevaluated  the patient and found that they have improved  Co morbidities that complicate the patient evaluation  Past Medical History:  Diagnosis Date   Hypercholesteremia    Hyperlipidemia    Hypertension    Lumbar herniated disc       Dispostion: Disposition decision including need for hospitalization was considered, and patient discharged from emergency department.    Final Clinical Impression(s) / ED Diagnoses Final diagnoses:  Asymptomatic hypertension        Elnor Jayson LABOR, DO 10/14/24 1015

## 2024-10-14 NOTE — ED Triage Notes (Signed)
 Elevated blood pressure since yesterday. 197/107 home readings. She takes 2 BP medications and took her BP medication last and this morning. Head pressure. Denies nausea, vomiting, visual disturbances, or nose bleeding.

## 2024-10-25 ENCOUNTER — Ambulatory Visit: Admitting: Physician Assistant

## 2024-10-25 DIAGNOSIS — M79641 Pain in right hand: Secondary | ICD-10-CM

## 2024-10-25 DIAGNOSIS — M79642 Pain in left hand: Secondary | ICD-10-CM | POA: Diagnosis not present

## 2024-10-25 NOTE — Progress Notes (Signed)
 Office Visit Note   Patient: Jessica Andrade           Date of Birth: 07/08/1971           MRN: [id number] Visit Date: 10/25/2024              Requested by: Delores Suzann CHRISTELLA, MD [address] [place],  [place] [address] PCP: Delores Suzann CHRISTELLA, MD   Assessment & Plan: Visit Diagnoses:  1. Bilateral hand pain     Plan: Patient is a pleasant 53 year old woman with right greater than left base of thumb pain.  She denies any injury but she is right-hand dominant and has done a lot of manual things with her hands working in [employer] of her life.  She did see primary care and had x-rays done which demonstrated CMC arthritis.  She does have braces.  I discussed about having her get injections she does not want to do that at this point.  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  We talked about maybe trying some hand therapy.  She would like to go forward with this she will contact me if she would like to reconsider injections  Follow-Up Instructions:   Orders:  Orders Placed This Encounter  Procedures   Ambulatory referral to Occupational Therapy   No orders of the defined types were placed in this encounter.     Procedures: No procedures performed   Clinical Data: No additional findings.   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    Objective: Vital Signs: LMP 07/05/2020   Physical Exam Constitutional:      Appearance: Normal appearance.  Skin:    General: Skin is warm and dry.  Neurological:     General: No focal deficit present.     Mental Status: She is alert and oriented to person, place, and time.  Psychiatric:         Mood and Affect: Mood normal.        Behavior: Behavior normal.     Ortho Exam Examination of her right hand she has a strong radial pulse mild soft tissue swelling but no induration or cellulitis at the base of the thumb.  Negative Finkelstein's test.  She has good grip strength good at duction and in abduction strength.  Good flexion extension of the wrist.  Brisk capillary refill.  She does have some tenderness with manipulation of the first [facility] joint Specialty Comments:  No specialty comments available.  Imaging: No results found.   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 07/15/2020   Borderline hyperlipidemia 07/05/2015   Essential hypertension 09/19/2007   Past Medical History:  Diagnosis Date   Hypercholesteremia    Hyperlipidemia    Hypertension    Lumbar herniated disc     Family History  Problem Relation Age of Onset   Healthy Mother  Hypertension Father    Heart attack Father 83    Past Surgical History:  Procedure Laterality Date   ANTERIOR CERVICAL DECOMP/DISCECTOMY FUSION N/A 08/20/2022   Procedure: C TWO -THREE ANTERIOR CERVICAL DECOMPRESSION/DISCECTOMY FUSION 1 LEVEL;  Surgeon: Cheryle Debby LABOR, MD;  Location: [facility] OR;  Service: Neurosurgery;  Laterality: N/A;   CESAREAN SECTION     CESAREAN SECTION     Social History   Occupational History   Not on file  Tobacco Use   Smoking status: Never    Passive exposure: Never   Smokeless tobacco: Never  Vaping Use   Vaping status: Never Used  Substance and Sexual Activity   Alcohol use: Not Currently   Drug use: Not Currently   Sexual activity: Not on file

## 2024-10-27 NOTE — Progress Notes (Deleted)
    SUBJECTIVE:   CHIEF COMPLAINT: BP  HPI:  Jessica Andrade is a 53 y.o.  with history notable for HTN presenting for follow up.   Discussed the use of AI scribe software for clinical note transcription with the patient, who gave verbal consent to proceed.  History of Present Illness  Patient seen in ED on 11/8 for HTN. Thought to be due to high salt diet.    PERTINENT  PMH / PSH/Family/Social History : HTN, dyslipidemia   OBJECTIVE:   LMP 07/05/2020   Today's weight:  Review of prior weights: There were no vitals filed for this visit.   Cardiac: Regular rate and rhythm. Normal S1/S2. No murmurs, rubs, or gallops appreciated. Lungs: Clear bilaterally to ascultation.  Abdomen: Normoactive bowel sounds. No tenderness to deep or light palpation. No rebound or guarding.  ***  Psych: Pleasant and appropriate   ASSESSMENT/PLAN:   Assessment & Plan Essential hypertension     Suzann Daring, MD  Family Medicine Teaching Service  [facility]

## 2024-10-30 ENCOUNTER — Ambulatory Visit: Admitting: Family Medicine

## 2024-10-30 DIAGNOSIS — I10 Essential (primary) hypertension: Secondary | ICD-10-CM

## 2025-01-09 ENCOUNTER — Ambulatory Visit: Admitting: Family Medicine

## 2025-01-09 ENCOUNTER — Encounter: Payer: Self-pay | Admitting: Family Medicine

## 2025-01-09 VITALS — BP 160/89 | HR 73 | Wt 161.2 lb

## 2025-01-09 DIAGNOSIS — G8929 Other chronic pain: Secondary | ICD-10-CM | POA: Insufficient documentation

## 2025-01-09 DIAGNOSIS — M25561 Pain in right knee: Secondary | ICD-10-CM

## 2025-01-09 DIAGNOSIS — I10 Essential (primary) hypertension: Secondary | ICD-10-CM | POA: Diagnosis present

## 2025-01-09 MED ORDER — OLMESARTAN-AMLODIPINE-HCTZ 20-5-12.5 MG PO TABS: 1.0000 | ORAL_TABLET | Freq: Every day | ORAL | 0 refills | Status: AC

## 2025-01-09 NOTE — Patient Instructions (Signed)
 It was wonderful to see you today.  Please bring ALL of your medications with you to every visit.    VISIT SUMMARY: During your visit, we discussed your recent emergency department visit for elevated blood pressure, chronic right knee pain. We reviewed your current medications and made adjustments to better manage your conditions.  YOUR PLAN: -HYPERTENSION: Hypertension means high blood pressure. Your blood pressure has been persistently high despite your current medications. We believe stress may be contributing to this. We have started you on a combination pill that includes amlodipine , hydrochlorothiazide , and olmesartan  20 mg to help better control your blood pressure. We will follow up in two weeks and have ordered blood work for today. You will need blood work again at your next visit.   -OSTEOARTHRITIS OF RIGHT KNEE: Osteoarthritis is a condition that causes pain and stiffness in the joints. Your right knee pain has worsened recently, and you have experienced clicking, which may indicate a meniscal tear. We recommend continuing physical therapy and using Voltaren  gel for pain relief. We may consider an MRI or ultrasound to evaluate for a meniscal tear and discuss the possibility of another cortisone injection if your symptoms persist.  INSTRUCTIONS: Please follow up in two weeks for a blood pressure check and to review your blood work. Continue with your current medications and physical therapy. Use Voltaren  gel as needed for knee pain. If your symptoms worsen or you have any concerns, please contact our office.  Contains text generated by [employer].   Thank you for choosing [facility].   Please call [phone] with any questions about today's appointment.   Areta Saliva, MD  Family Medicine

## 2025-01-09 NOTE — Progress Notes (Signed)
" ° °  SUBJECTIVE:   CHIEF COMPLAINT / HPI:  Discussed the use of AI scribe software for clinical note transcription with the patient, who gave verbal consent to proceed.  History of Present Illness Jessica Andrade is a 53 year old female with hypertension who presents for follow-up after an emergency department visit for elevated blood pressure.  Hypertension and elevated blood pressure - Recent emergency department visit (10/2024) for markedly elevated blood pressure, no end organ damage at that time she was discharged. - Home blood pressure monitoring shows persistently elevated readings despite adherence to Norvasc  5 mg and hydrochlorothiazide  25 mg daily. (Log in media tab)  - No chest pain or shortness of breath. - Pressure sensation in head when blood pressure is high. - Believes work-related and family stress contribute to elevated blood pressure. Says that she wants to deal with it herself. She says she has improved her stress herself.  - Concerned that joint supplement containing sodium may worsen blood pressure.  Chronic right knee pain - Chronic right knee pain for three years, recently for two months has worsened . - Recurrent pain after prior relief from injection. Is adamant does not want injection again.  - Intermittent clicking of the knee. Does not remember any trauma to the knee or any falls.  - Uses Voltaren  gel for pain relief. - Uses joint supplement.     PERTINENT  PMH / PSH: HTN, OA,   OBJECTIVE:  BP (!) 174/81   Pulse 72   Wt 161 lb 3.2 oz (73.1 kg)   LMP 07/05/2020   SpO2 96%   BMI 29.48 kg/m   General: well appearing, in no acute distress CV: RRR, radial pulses equal and palpable Resp: Normal work of breathing on room air, CTAB Neuro: Alert & Oriented x 4, normal gait, Upper and lower extremity strength 5/5, PERRLA, EOMI  MSK: right knee with mild edema along the lateral joint line, TTP along lateral joint line and slightly inferior, negative anterior and  posterior drawer, positive Mcmurrays. Clicking with extension.   ASSESSMENT/PLAN:   Assessment & Plan Essential hypertension Uncontrolled hypertension. Though home readings are better than clinic readings with average systolics in 140s at home and diastolic of 80s. Current regimen includes amlodipine , hydrochlorothiazide  and patient is adherent, Stress identified as a contributing factor. Discussed discontinuing joint supplement with sodium. Combination therapy considered for better control. - Initiated combination olmesartan -amlodipine -hydrochlorothiazide  20-5-12.5 - Scheduled follow-up appointment in two weeks. - Ordered BMP today  - Discussed therapy for stress management and patient declined  Chronic pain of right knee Patient most likely has degenerative meniscal tear with chronic osteoarthritis. No traumatic injury to indicate acute traumatic meniscal tear.  - Patient declines physical therapy as she is currently getting occupational therapy for her wrists and declines more therapy.  - Use Voltaren  gel for pain and inflammation. - Consider MRI or ultrasound to evaluate for meniscal tear. Wants to think about this and declines order at this time.  - Discuss potential for further cortisone injection if symptoms persist. Wants to avoid if possible.  - Follow up in 2 weeks with PCP      Areta Saliva, MD [facility] "

## 2025-01-09 NOTE — Assessment & Plan Note (Signed)
 Uncontrolled hypertension. Though home readings are better than clinic readings with average systolics in 140s at home and diastolic of 80s. Current regimen includes amlodipine , hydrochlorothiazide  and patient is adherent, Stress identified as a contributing factor. Discussed discontinuing joint supplement with sodium. Combination therapy considered for better control. - Initiated combination olmesartan -amlodipine -hydrochlorothiazide  20-5-12.5 - Scheduled follow-up appointment in two weeks. - Ordered BMP today  - Discussed therapy for stress management and patient declined

## 2025-01-09 NOTE — Assessment & Plan Note (Signed)
"  Patient most likely has degenerative meniscal tear with chronic osteoarthritis. No traumatic injury to indicate acute traumatic meniscal tear.  - Patient declines physical therapy as she is currently getting occupational therapy for her wrists and declines more therapy.  - Use Voltaren  gel for pain and inflammation. - Consider MRI or ultrasound to evaluate for meniscal tear. Wants to think about this and declines order at this time.  - Discuss potential for further cortisone injection if symptoms persist. Wants to avoid if possible.  - Follow up in 2 weeks with PCP "

## 2025-01-10 ENCOUNTER — Ambulatory Visit: Payer: Self-pay | Admitting: Family Medicine

## 2025-01-10 LAB — BASIC METABOLIC PANEL WITH GFR
BUN/Creatinine Ratio: 20 (ref 9–23)
BUN: 13 mg/dL (ref 6–24)
CO2: 26 mmol/L (ref 20–29)
Calcium: 9.8 mg/dL (ref 8.7–10.2)
Chloride: 100 mmol/L (ref 96–106)
Creatinine, Ser: 0.64 mg/dL (ref 0.57–1.00)
Glucose: 89 mg/dL (ref 70–99)
Potassium: 3.6 mmol/L (ref 3.5–5.2)
Sodium: 140 mmol/L (ref 134–144)
eGFR: 106 mL/min/{1.73_m2}

## 2025-01-26 ENCOUNTER — Ambulatory Visit: Admitting: Family Medicine
